# Patient Record
Sex: Male | Born: 1975 | Race: Black or African American | Hispanic: No | Marital: Single | Smoking: Current every day smoker
Health system: Southern US, Community
[De-identification: ages and names within clinical notes are randomized; demographics above are authoritative.]

## PROBLEM LIST (undated history)

## (undated) HISTORY — PX: SCAPHOID FRACTURE SURGERY: SHX769

## (undated) HISTORY — PX: ROTATOR CUFF REPAIR: SHX139

## (undated) HISTORY — PX: NO PAST SURGERIES: SHX2092

---

## 2004-11-02 ENCOUNTER — Emergency Department (HOSPITAL_COMMUNITY)
Admission: EM | Admit: 2004-11-02 | Discharge: 2004-11-02 | Payer: No Typology Code available for payment source | Admitting: Emergency Medicine

## 2005-07-31 ENCOUNTER — Emergency Department (HOSPITAL_COMMUNITY): Admission: EM | Admit: 2005-07-31 | Discharge: 2005-07-31 | Payer: Self-pay | Admitting: Emergency Medicine

## 2010-03-11 ENCOUNTER — Emergency Department (HOSPITAL_COMMUNITY)
Admission: EM | Admit: 2010-03-11 | Discharge: 2010-03-12 | Payer: No Typology Code available for payment source | Admitting: Emergency Medicine
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
## 2013-04-10 ENCOUNTER — Ambulatory Visit (INDEPENDENT_AMBULATORY_CARE_PROVIDER_SITE_OTHER): Payer: No Typology Code available for payment source | Admitting: Emergency Medicine

## 2013-04-10 VITALS — BP 139/87 | HR 98 | Temp 98.2°F | Resp 16 | Ht 72.5 in | Wt 185.6 lb

## 2013-04-10 DIAGNOSIS — S335XXA Sprain of ligaments of lumbar spine, initial encounter: Secondary | ICD-10-CM

## 2013-04-10 LAB — POCT URINALYSIS DIPSTICK
Blood, UA: NEGATIVE
Protein, UA: NEGATIVE
Spec Grav, UA: >=1.03
Urobilinogen, UA: 1
pH, UA: 6

## 2013-04-10 MED ORDER — TRAMADOL HCL 50 MG PO TABS: 50.0000 mg | ORAL_TABLET | Freq: Three times a day (TID) | ORAL | Status: DC | PRN

## 2013-04-10 MED ORDER — NAPROXEN SODIUM 550 MG PO TABS: 550.0000 mg | ORAL_TABLET | Freq: Two times a day (BID) | ORAL | Status: DC

## 2013-04-10 MED ORDER — CYCLOBENZAPRINE HCL 10 MG PO TABS: 10.0000 mg | ORAL_TABLET | Freq: Three times a day (TID) | ORAL | Status: DC | PRN

## 2013-04-10 NOTE — Progress Notes (Addendum)
[facility] [address], [place] [address] [phone]  Date:  04/10/2013   Name:  Nicholas Lawson   DOB:  1976/10/07   MRN:  [id number]  PCP:  No PCP Per Patient    Chief Complaint: Back Pain   History of Present Illness:  Nicholas Lawson is a 37 y.o. very pleasant male patient who presents with the following:  1 week history of low back pain that worsens with bending and twisting.  Non radiating.  No neuro symptoms.  No weakness or pain radiation.  No GI or GU symptoms.  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  Also has bilateral lower flank pains that come and go with no particular periodicity or provocative or ameliorating factor.  Denies prior low back pain or injury.  No improvement with over the counter medications or other home remedies. Denies other complaint or health concern today..  There are no active problems to display for this patient.   History reviewed. No pertinent past medical history.  History reviewed. No pertinent past surgical history.  History  Substance Use Topics  . Smoking status: Not on file  . Smokeless tobacco: Not on file  . Alcohol Use: Not on file    Family History  Problem Relation Age of Onset  . Hypertension Mother   . Hypertension Father     No Known Allergies  Medication list has been reviewed and updated.  No current outpatient prescriptions on file prior to visit.   No current facility-administered medications on file prior to visit.    Review of Systems:  As per HPI, otherwise negative.    Physical Examination: Filed Vitals:   04/10/13 1429  BP: 139/87  Pulse: 98  Temp: 98.2 F (36.8 C)  Resp: 16   Filed Vitals:   04/10/13 1429  Height: 6' 0.5" (1.842 m)  Weight: 185 lb 9.6 oz (84.188 kg)   Body mass index is 24.81 kg/(m^2). Ideal Body Weight: Weight in (lb) to have BMI = 25: 186.5   GEN: WDWN, NAD, Non-toxic, Alert & Oriented x 3 HEENT: Atraumatic, Normocephalic.  Ears and Nose: No  external deformity. EXTR: No clubbing/cyanosis/edema NEURO: Normal gait.  PSYCH: Normally interactive. Conversant. Not depressed or anxious appearing.  Calm demeanor.  BACK:  Tender paraspinous muscles in LSpine.  No ecchymosis or CVA tenderness.  Assessment and Plan: Lumbar strain Anaprox Flexeril Tramadol Local heat   Signed,  Phillips Odor, MD   Results for orders placed in visit on 04/10/13  POCT URINALYSIS DIPSTICK      Result Value Range   Color, UA dark yellow     Clarity, UA clear     Glucose, UA neg     Bilirubin, UA small     Ketones, UA trace     Spec Grav, UA >=1.030     Blood, UA neg     pH, UA 6.0     Protein, UA neg     Urobilinogen, UA 1.0     Nitrite, UA neg     Leukocytes, UA Negative

## 2013-04-10 NOTE — Patient Instructions (Addendum)

## 2014-03-03 ENCOUNTER — Emergency Department (HOSPITAL_COMMUNITY)
Admission: EM | Admit: 2014-03-03 | Discharge: 2014-03-03 | Disposition: A | Discharge status: Home / Self Care | Payer: No Typology Code available for payment source | Attending: Emergency Medicine | Admitting: Emergency Medicine

## 2014-03-03 ENCOUNTER — Encounter (HOSPITAL_COMMUNITY): Payer: Self-pay | Admitting: Emergency Medicine

## 2014-03-03 DIAGNOSIS — X500XXA Overexertion from strenuous movement or load, initial encounter: Secondary | ICD-10-CM | POA: Insufficient documentation

## 2014-03-03 DIAGNOSIS — N509 Disorder of male genital organs, unspecified: Secondary | ICD-10-CM | POA: Insufficient documentation

## 2014-03-03 DIAGNOSIS — Y9389 Activity, other specified: Secondary | ICD-10-CM | POA: Insufficient documentation

## 2014-03-03 DIAGNOSIS — F17200 Nicotine dependence, unspecified, uncomplicated: Secondary | ICD-10-CM | POA: Insufficient documentation

## 2014-03-03 DIAGNOSIS — M545 Low back pain, unspecified: Secondary | ICD-10-CM

## 2014-03-03 DIAGNOSIS — IMO0002 Reserved for concepts with insufficient information to code with codable children: Secondary | ICD-10-CM | POA: Insufficient documentation

## 2014-03-03 DIAGNOSIS — Y929 Unspecified place or not applicable: Secondary | ICD-10-CM | POA: Insufficient documentation

## 2014-03-03 DIAGNOSIS — R269 Unspecified abnormalities of gait and mobility: Secondary | ICD-10-CM | POA: Insufficient documentation

## 2014-03-03 MED ORDER — IBUPROFEN 800 MG PO TABS: 800.0000 mg | ORAL_TABLET | Freq: Three times a day (TID) | ORAL | Status: DC | PRN

## 2014-03-03 MED ORDER — METHOCARBAMOL 500 MG PO TABS: 500.0000 mg | ORAL_TABLET | Freq: Three times a day (TID) | ORAL | Status: DC | PRN

## 2014-03-03 NOTE — ED Provider Notes (Signed)
CSN: [id number]     Arrival date & time 03/03/14  1629 History  This chart was scribed for non-physician practitioner working with Nelia Shiobert L Beaton, MD by Ashley JacobsBrittany Andrews, ED scribe. This patient was seen in room WTR5/WTR5 and the patient's care was started at 5:21 PM.   First MD Initiated Contact with Patient 03/03/14 1719     Chief Complaint  Patient presents with  . Back Pain    lower     (Consider location/radiation/quality/duration/timing/severity/associated sxs/prior Treatment) Patient is a 38 y.o. male presenting with back pain. The history is provided by the patient and medical records. No language interpreter was used.  Back Pain Associated symptoms: no abdominal pain, no dysuria and no fever    HPI Comments: Nicholas Ginsremayne Casebier is a 38 y.o. male who presents to the Emergency Department complaining of lumbar pain for the past two weeks and is much worse today. Today pt twisted his back while working. Pt has a hx of chronic back pain from strenuous work. Does a lot of heavy lifting at work.  Denies prior injury. The pain radiates to his bilateral buttocks and groin. The pain is 7/10 in severity. He has tried [facility] for pain with no relief.  Worse with certain positions and movement.  Better with elevating his legs to take the pressure off his back. Denies constipation. Denies hx of cancer and IV drug use. 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 He does not have a PCP.  Denies fevers, chills, abdominal pain, loss of control of bowel or bladder, weakness of numbness of the extremities, saddle anesthesia, bowel, urinary, or vaginal complaints.      History reviewed. No pertinent past medical history. History reviewed. No pertinent past surgical history. Family History  Problem Relation Age of Onset  . Hypertension Mother   . Hypertension Father    History  Substance Use Topics  . Smoking status: Current Every Day Smoker  . Smokeless tobacco: Not on file  . Alcohol Use:  No    Review of Systems  Constitutional: Negative for fever, chills and diaphoresis.  Gastrointestinal: Negative for vomiting, abdominal pain, diarrhea and constipation.  Genitourinary: Positive for testicular pain. Negative for dysuria, urgency, frequency, hematuria, decreased urine volume and difficulty urinating.  Musculoskeletal: Positive for arthralgias, back pain, gait problem and myalgias.  All other systems reviewed and are negative.     Allergies  Review of patient's allergies indicates no known allergies.  Home Medications   Prior to Admission medications   Medication Sig Start Date End Date Taking? Authorizing Provider  Aspirin-Salicylamide-Caffeine (BC FAST PAIN RELIEF) [phone] MG PACK Take 1 packet by mouth as needed (pain).   Yes Historical Provider, MD   BP 150/105  Pulse 113  Temp(Src) 98 F (36.7 C) (Oral)  Resp 20  SpO2 99% Physical Exam  Nursing note and vitals reviewed. Constitutional: He appears well-developed and well-nourished. No distress.  HENT:  Head: Normocephalic and atraumatic.  Neck: Neck supple.  Pulmonary/Chest: Effort normal.  Abdominal: Soft. He exhibits no distension and no mass. There is no tenderness. There is no rebound and no guarding.  Musculoskeletal: Normal range of motion. He exhibits no edema and no tenderness.  Spine nontender, no crepitus, or stepoffs. Lower extremities:  Strength 5/5, sensation intact, distal pulses intact.   Mild muscular tenderness.    Neurological: He is alert.  Skin: He is not diaphoretic.    ED Course  Procedures (including critical care time) DIAGNOSTIC STUDIES: Oxygen Saturation  is 99% on room air, normal by my interpretation.    COORDINATION OF CARE:  5:25 PM Discussed course of care with pt . Pt understands and agrees.    Labs Review Labs Reviewed - No data to display  Imaging Review No results found.   EKG Interpretation None      Filed Vitals:   03/03/14 1743  BP: 146/93   Pulse: 92  Temp: 98.7 F (37.1 C)  Resp: 18     MDM   Final diagnoses:  Low back pain    Afebrile, nontoxic patient with mechanical low back pain. No red flags.  Discussed result, findings, treatment, and follow up  with patient.  Pt given return precautions.  Pt verbalizes understanding and agrees with plan.       I personally performed the services described in this documentation, which was scribed in my presence. The recorded information has been reviewed and is accurate.    Nicholas Dredgemily Redith Drach, PA-C 03/03/14 1815  Nicholas Dredgemily Leander Tout, PA-C 03/03/14 1825

## 2014-03-03 NOTE — ED Notes (Signed)
Pt c/o lower back pain x 2 weeks, states he twisted it at work today and now it is worse.

## 2014-03-03 NOTE — Discharge Instructions (Signed)
Read the information below.  Use the prescribed medication as directed.  Please discuss all new medications with your pharmacist.  You may return to the Emergency Department at any time for worsening condition or any new symptoms that concern you.   If you develop fevers, loss of control of bowel or bladder, weakness or numbness in your legs, or are unable to walk, return to the ER for a recheck.  ° ° °Back Pain, Adult °Low back pain is very common. About 1 in 5 people have back pain. The cause of low back pain is rarely dangerous. The pain often gets better over time. About half of people with a sudden onset of back pain feel better in just 2 weeks. About 8 in 10 people feel better by 6 weeks.  °CAUSES °Some common causes of back pain include: °· Strain of the muscles or ligaments supporting the spine. °· Wear and tear (degeneration) of the spinal discs. °· Arthritis. °· Direct injury to the back. °DIAGNOSIS °Most of the time, the direct cause of low back pain is not known. However, back pain can be treated effectively even when the exact cause of the pain is unknown. Answering your caregiver's questions about your overall health and symptoms is one of the most accurate ways to make sure the cause of your pain is not dangerous. If your caregiver needs more information, he or she may order lab work or imaging tests (X-rays or MRIs). However, even if imaging tests show changes in your back, this usually does not require surgery. °HOME CARE INSTRUCTIONS °For many people, back pain returns. Since low back pain is rarely dangerous, it is often a condition that people can learn to manage on their own.  °· Remain active. It is stressful on the back to sit or stand in one place. Do not sit, drive, or stand in one place for more than 30 minutes at a time. Take short walks on level surfaces as soon as pain allows. Try to increase the length of time you walk each day. °· Do not stay in bed. Resting more than 1 or 2 days can  delay your recovery. °· Do not avoid exercise or work. Your body is made to move. It is not dangerous to be active, even though your back may hurt. Your back will likely heal faster if you return to being active before your pain is gone. °· Pay attention to your body when you  bend and lift. Many people have less discomfort when lifting if they bend their knees, keep the load close to their bodies, and avoid twisting. Often, the most comfortable positions are those that put less stress on your recovering back. °· Find a comfortable position to sleep. Use a firm mattress and lie on your side with your knees slightly bent. If you lie on your back, put a pillow under your knees. °· Only take over-the-counter or prescription medicines as directed by your caregiver. Over-the-counter medicines to reduce pain and inflammation are often the most helpful. Your caregiver may prescribe muscle relaxant drugs. These medicines help dull your pain so you can more quickly return to your normal activities and healthy exercise. °· Put ice on the injured area. °· Put ice in a plastic bag. °· Place a towel between your skin and the bag. °· Leave the ice on for 15-20 minutes, 03-04 times a day for the first 2 to 3 days. After that, ice and heat may be alternated to reduce pain and spasms. °· Ask your caregiver about trying back exercises and gentle massage. This may be of some   benefit. °· Avoid feeling anxious or stressed. Stress increases muscle tension and can worsen back pain. It is important to recognize when you are anxious or stressed and learn ways to manage it. Exercise is a great option. °SEEK MEDICAL CARE IF: °· You have pain that is not relieved with rest or medicine. °· You have pain that does not improve in 1 week. °· You have new symptoms. °· You are generally not feeling well. °SEEK IMMEDIATE MEDICAL CARE IF:  °· You have pain that radiates from your back into your legs. °· You develop new bowel or bladder control  problems. °· You have unusual weakness or numbness in your arms or legs. °· You develop nausea or vomiting. °· You develop abdominal pain. °· You feel faint. °Document Released: 11/04/2005 Document Revised: 05/05/2012 Document Reviewed: 03/25/2011 °ExitCare® Patient Information ©2014 [employer]. ° ° ° °Emergency Department Resource Guide °1) Find a Doctor and Pay Out of Pocket °Although you won't have to find out who is covered by your insurance plan, it is a good idea to ask around and get recommendations. You will then need to call the office and see if the doctor you have chosen will accept you as a new patient and what types of options they offer for patients who are self-pay. Some doctors offer discounts or will set up payment plans for their patients who do not have insurance, but you will need to ask so you aren't surprised when you get to your appointment. ° °2) Contact Your Local Health Department °Not all health departments have doctors that can see patients for sick visits, but many do, so it is worth a call to see if yours does. If you don't know where your local health department is, you can check in your phone book. The CDC also has a tool to help you locate your state's health department, and many state websites also have listings of all of their local health departments. ° °3) Find a Walk-in Clinic °If your illness is not likely to be very severe or complicated, you may want to try a walk in clinic. These are popping up all over the country in pharmacies, drugstores, and shopping centers. They're usually staffed by nurse practitioners or physician assistants that have been trained to treat common illnesses and complaints. They're usually fairly quick and inexpensive. However, if you have serious medical issues or chronic medical problems, these are probably not your best option. ° °No Primary Care Doctor: °- Call [facility] at  832-8000 - they can help you locate a primary care doctor that   accepts your insurance, provides certain services, etc. °- Physician Referral Service- 1-800-533-3463 ° °Chronic Pain Problems: °Organization         Address  Phone   Notes  °[facility]  (336) 297-2271 Patients need to be referred by their primary care doctor.  ° °Medication Assistance: °Organization         Address  Phone   Notes  °[place] [facility] [address] °[place], [place] [address] (336) 641-8030 --Must be a resident of [place] °-- Must have NO insurance coverage whatsoever (no Medicaid/ Medicare, etc.) °-- The pt. MUST have a primary care doctor that directs their care regularly and follows them in the community °  °[facility]  (866) 331-1348   °[employer]  (888) 892-1162   ° °Agencies that provide inexpensive medical care: °Organization         Address  Phone     Notes  °[facility]  (336) 832-8035   °[facility]    (336) 832-7272   °[facility] [address] °[place], [place] [address] (336) 832-4777   °[facility] [address], °[place] (336) 271-4999   °[facility]    (336) 373-0678   °[facility]    (336) 272-1050   °[facility] ° [address], [place] Phone:  (336) 832-4444, Fax:  (336) 832-4440 Hours of Operation:  9 am - 6 pm, M-F.  Also accepts Medicaid/Medicare and self-pay.  °[facility] ° [address], [place] Phone: (336) 832-3150, Fax: (336) 832-3151. Hours of Operation:  8:30 am - 5:30 pm, M-F.  Also accepts Medicaid and self-pay.  °[facility] [place] [address], [place] Phone: (336) 878-6027   °[facility] [address], [place], [place] (336)723-1848, Ext. 123 Mondays & Thursdays: 7-9 AM.  First 15 patients are seen on a first come, first serve basis. °  ° °Medicaid-accepting [place] Providers: ° °Organization          Address  Phone   Notes  °[facility] [address], [place] (336) 641-2100 Also accepts self-pay patients.  °[facility] [address], [place] ° (336) 856-9996   °[facility] [address], [place] (336) 288-8857   °[facility] [address] [place] [address], [place] (336) 299-7000   °Veita Bland [address], [place]  ° (336) 373-1557 Only accepts [employer] Access Medicaid patients after they have their name applied to their card.  ° °Self-Pay (no insurance) in [place]: ° °Organization         Address  Phone   Notes  °Sickle Cell Patients, [facility] [address], [place] (336) 832-1970   °[facility] [address], [place] (336) 832-4400   ° [facility] ° [address], [place] (336) 992-4800   °[facility]/Dr. Osei-Bonsu ° [address] [place] [address], [place] or [address], [place] (336) 841-8500 Phone number for both [place] and [place] locations is the same.  °[facility] [address], [place] (336) 299-0000   °[facility] [address] [place] [address], [place] or [address] (336) 852-7530 °(336) 878-2260   °[facility] [address], [place] (336) 350-1642, phone; (336) 294-5005, fax Sees patients 1st and 3rd Saturday of every month.  Must not qualify for public or private insurance (i.e. Medicaid, Medicare, [place] Health Choice, Veterans' Benefits) • Household income should be no more than 200% of the poverty level •The clinic cannot treat you if you are pregnant or think you are pregnant • Sexually transmitted diseases are not treated at the clinic.  ° ° °Dental Care: °Organization         Address  Phone  Notes  °[place] [facility] [address],  [place] (336) 641-6152 Accepts children up to age 21 who are enrolled in Medicaid or [place] Health Choice; pregnant women with a Medicaid card; and children who have applied for Medicaid or [place] Health Choice, but were declined, whose parents can pay a reduced fee at time of service.  °[place]   [facility] [place]  [address], [place] (336) 641-7733 Accepts children up to age 21 who are enrolled in Medicaid or [place] Health Choice; pregnant women with a Medicaid card; and children who have applied for Medicaid or [place] Health Choice, but were declined, whose parents can pay a reduced fee at time of service.  °[facility] ° [address], [place] (336) 641-4533 Patients are seen by appointment only. Walk-ins are not accepted. [facility] will see patients 18 years of age and older. °Monday - Tuesday (8am-5pm) °Most Wednesdays (8:30-5pm) °$30 per visit, cash only  °[facility] ° [address], [place] (336) 641-4533 Patients are seen by appointment only. Walk-ins are not accepted. [facility] will see patients 18 years of age and older. °One Wednesday Evening (Monthly: Volunteer Based).  $30 per visit, cash only  °[facility]  (919) 537-3737 for adults; Children under age 4, call Graduate Pediatric Dentistry at (919) [phone]. Children aged 4-14, please call (919) [phone] to request a pediatric application. ° Dental services are provided in all areas of dental care including fillings, crowns and bridges, complete and partial dentures, implants, gum treatment, root canals, and extractions. Preventive care is also provided. Treatment is provided to both adults and children. °Patients are selected via a lottery and there is often a waiting list. °  °[facility] [address], °[place] ° (336) 763-8833 [email or web address] °  °[facility] [address], [place], [place]  (336)723-1848, Ext. 123 Second and Fourth Thursday of each month, opens at 6:30 AM; Clinic ends at 9 AM.  Patients are seen on a first-come first-served basis, and a limited number are seen during each clinic.  ° °[facility] ° [address], [place], [place] (336) 723-7904   Eligibility Requirements °You must have lived in [place], [place], or [place] counties for at least the last three months. °  You cannot be eligible for state or federal sponsored healthcare insurance, including Veterans Administration, Medicaid, or Medicare. °  You generally cannot be eligible for healthcare insurance through your employer.  °  How to apply: °Eligibility screenings are held every Tuesday and Wednesday afternoon from 1:00 pm until 4:00 pm. You do not need an appointment for the interview!  °[facility] [address], [place], [place] 336-631-2330   °[facility]  336-342-8273   °[facility]  336-703-3100   °[facility]  336-570-6415   ° °Behavioral Health Resources in the Community: °Intensive Outpatient Programs °Organization         Address  Phone  Notes  °[facility] [address], [place], [place] 336-878-6098   °[facility] [address], [place], [place] 336-832-9800   °[facility] [address], [place],  ° 336-882-2125   °[facility] [address],  °[place],  1-800-853-5163 or 336-641-4981   °Substance Abuse Resources °Organization         Address  Phone  Notes  °[facility]  336-882-2125   °[facility]  336-784-9470   °[facility]  336-285-9073   °[facility]  336-845-3988   °Residential & Outpatient Substance Abuse Program  1-800-659-3381   °Psychological Services °Organization         Address  Phone  Notes  °[facility]  336- 832-9600   °[facility]  336- 378-7881    °[facility] [address], [place] 1-800-853-5163 or 336-641-4981   ° °Mobile Crisis Teams °Organization         Address  Phone  Notes  °[facility], Mobile Crisis Care Unit  1-877-626-1772   °Assertive °[facility] ° [address] [place], [place] 336-834-9664   °Sharon DeEsch [address] °[place] 336-554-5454   ° °Self-Help/Support Groups °Organization         Address  Phone             Notes  °[facility] - variety of support groups  336- 373-1402 Call for more information  °Narcotics Anonymous (NA), [facility] [address], °[place]  2 meetings at this location  ° °Residential Treatment Programs °Organization         Address  Phone  Notes  °[facility] [address],    °[place]  1-866-801-8205   °[facility] ° [address], [place], [place] 704-293-8524   °[facility] [address], [place] 336-845-3988 Admissions: 8am-3pm M-F  °[facility] [address]    °[place], [place] 336-841-1104   °[facility] [address], [place], [place] 336-379-7146   °[facility] [address]  °[place], [place] 336-285-9073   °[facility] - Intensive Outpatient [address], [place], [place] 336-852-3033   °[facility] [address]  °[place], [place] 1-877-615-2722 or 336-784-9470   °[facility] ([facility] [address] [place], [place] 336-227-7417 Accepts Medicaid  °[facility] [address]  °[place] 1-800-659-3381 Substance Abuse/Addiction Treatment  ° °[place] Behavioral Health Resources °Organization         Address  Phone  Notes  °[facility]  (888) 581-9988   °Julie Brannon, PhD [address] [place], [place]   (336) 349-5553 or (336) 951-0000   °[facility]   [address] °[place], [place] (336) 349-4454   °[facility] [address], [place], [place] (336) 342-8316 Insurance/Medicaid/sponsorship through [facility]  °[facility] [address]                                    [place], [place] (336) 342-8316 Therapy/tele-psych/case  °[facility] [address]  ° [place], [place] (336) 349-2233    °Dr. Arfeen  (336) 349-4544   °[facility] [place]  [employer] [place] [facility] 1) [address], [place] °2) [address], [place] °3)  [address], [place] (336) 349-3220 °(336) 342-7768 ° °(336) 342-8140   °[place] [facility] (336) 342-1394 or (336) 342-3537 (After Hours)    ° ° ° °

## 2014-03-10 NOTE — ED Provider Notes (Signed)
Medical screening examination/treatment/procedure(s) were performed by non-physician practitioner and as supervising physician I was immediately available for consultation/collaboration.    Nelia Shiobert L Alrick Cubbage, MD 03/10/14 [phone]

## 2014-03-12 ENCOUNTER — Ambulatory Visit: Payer: No Typology Code available for payment source

## 2014-03-12 ENCOUNTER — Ambulatory Visit (INDEPENDENT_AMBULATORY_CARE_PROVIDER_SITE_OTHER): Payer: No Typology Code available for payment source | Admitting: Family Medicine

## 2014-03-12 VITALS — BP 120/76 | HR 89 | Temp 98.0°F | Resp 18 | Ht 71.0 in | Wt 180.0 lb

## 2014-03-12 DIAGNOSIS — M545 Low back pain, unspecified: Secondary | ICD-10-CM

## 2014-03-12 DIAGNOSIS — S335XXA Sprain of ligaments of lumbar spine, initial encounter: Secondary | ICD-10-CM

## 2014-03-12 IMAGING — CR DG LUMBAR SPINE COMPLETE 4+V
5 series · 5 of 5 positions shown · non-contrast
Comparison: Plain films lumbar spine [DATE].

CLINICAL DATA: Low back pain for 2 weeks.

EXAM:
LUMBAR SPINE - COMPLETE 4+ VIEW

[AP]
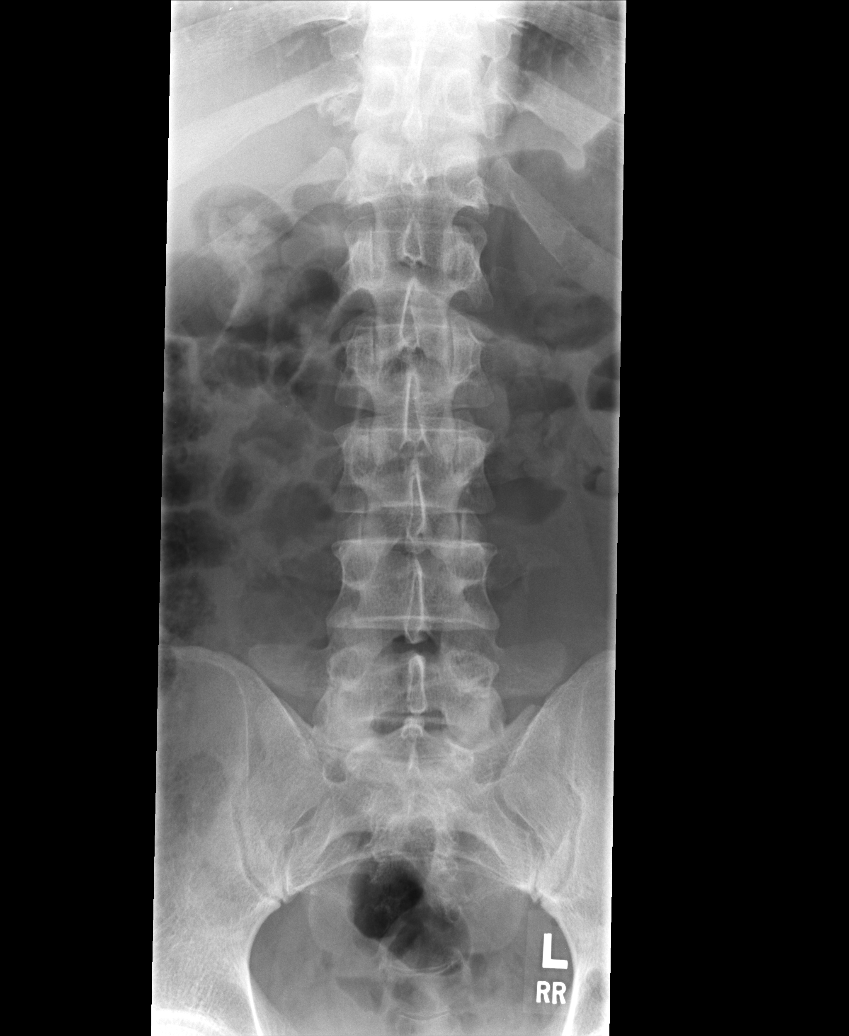

[rpo]
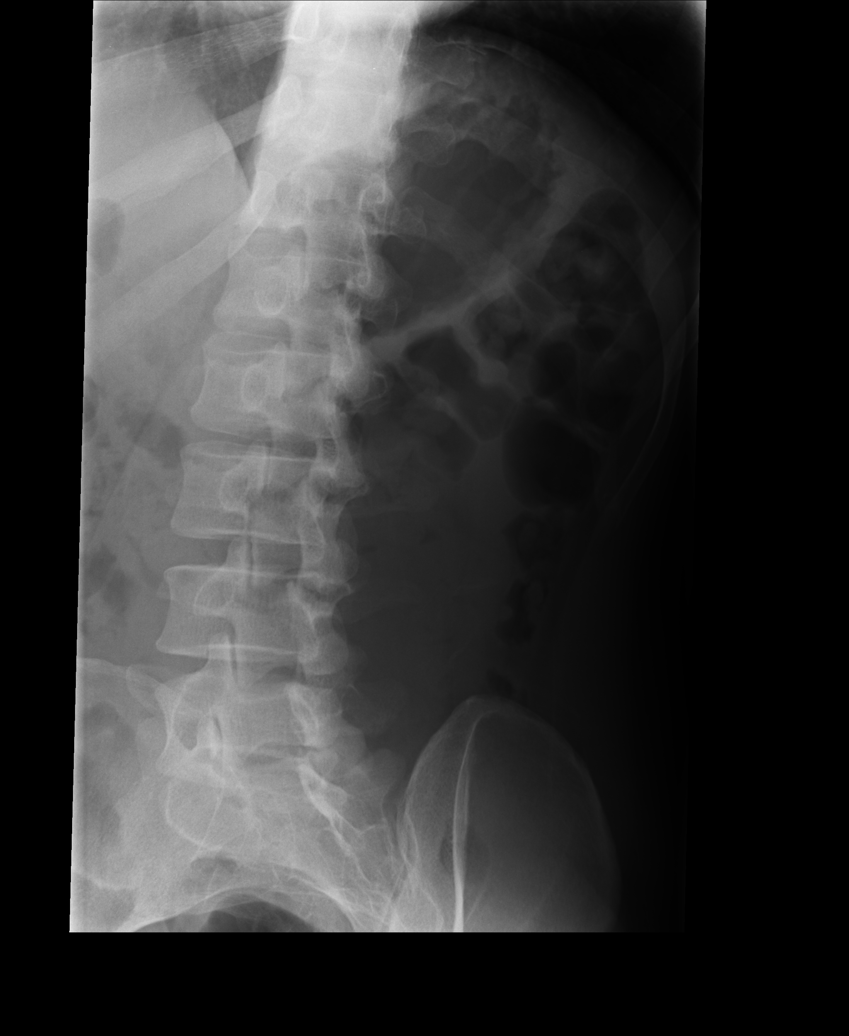

[lpo]
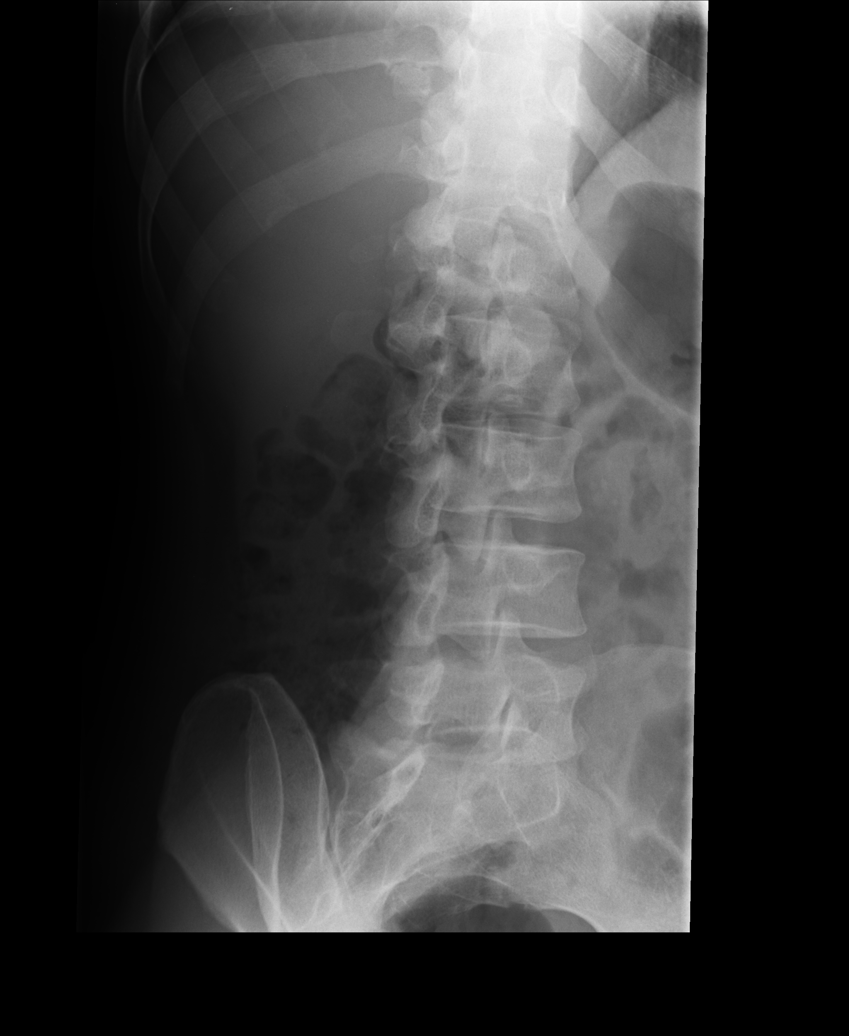

[lateral]
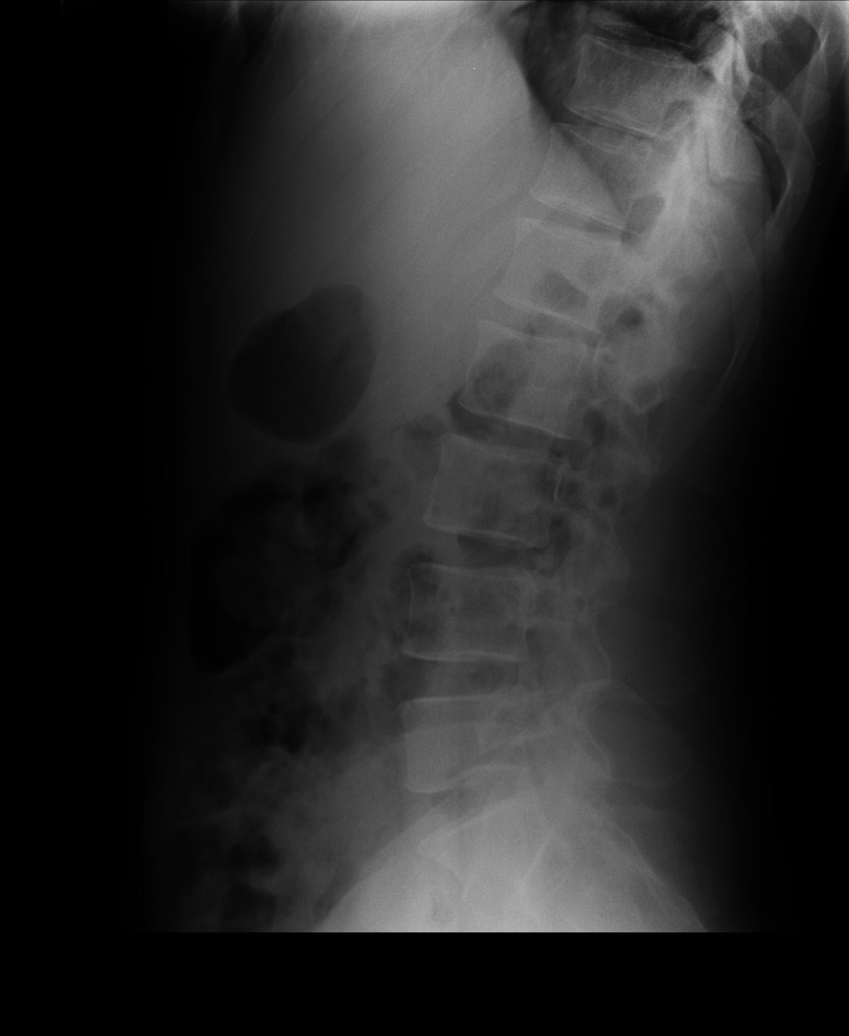

[l5 s1]
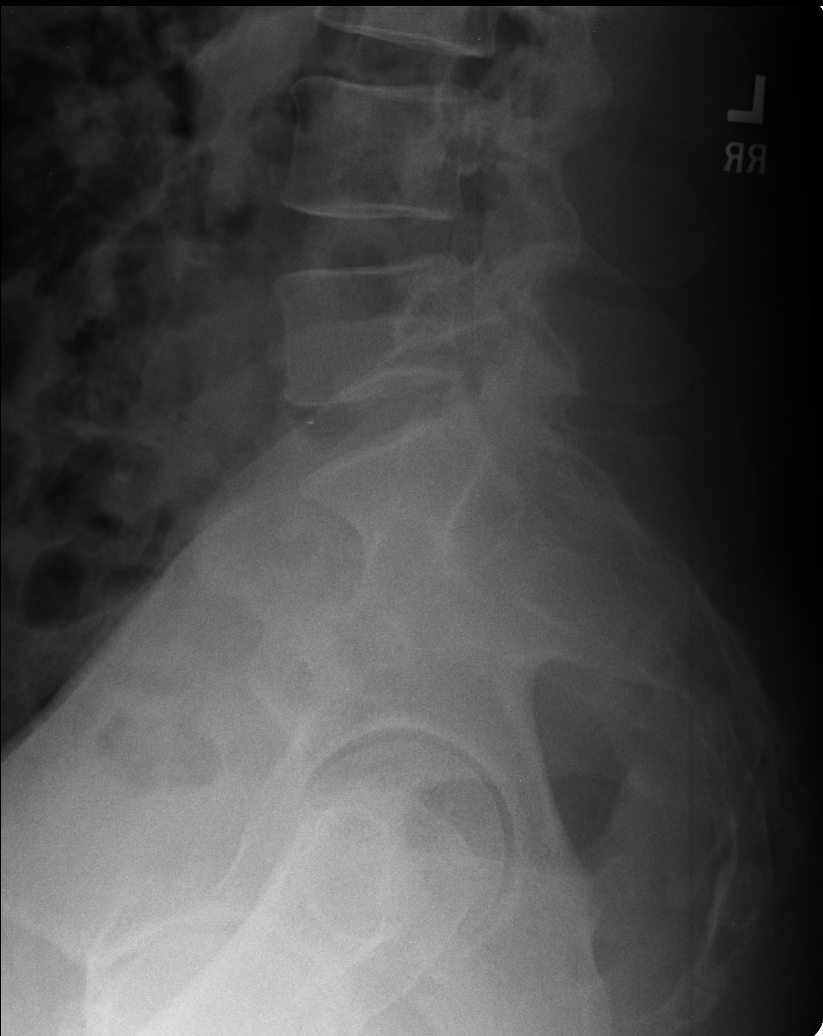

[5 of 5 positions shown; findings below may reference images not displayed]

FINDINGS: Vertebral body height and alignment are maintained. No pars
interarticularis defect is identified. Intervertebral disc space
height is normal. Paraspinous structures are unremarkable.
IMPRESSION: Negative exam.

## 2014-03-12 MED ORDER — IBUPROFEN 800 MG PO TABS: 800.0000 mg | ORAL_TABLET | Freq: Three times a day (TID) | ORAL | Status: DC | PRN

## 2014-03-12 MED ORDER — METHOCARBAMOL 500 MG PO TABS: 500.0000 mg | ORAL_TABLET | Freq: Three times a day (TID) | ORAL | Status: DC | PRN

## 2014-03-12 MED ORDER — TRAMADOL HCL 50 MG PO TABS: 50.0000 mg | ORAL_TABLET | Freq: Three times a day (TID) | ORAL | Status: DC | PRN

## 2014-03-12 NOTE — Patient Instructions (Signed)

## 2014-03-12 NOTE — Progress Notes (Addendum)
This chart was scribed for Ethelda ChickKristi M Rella Egelston, MD by Luisa DagoPriscilla Tutu, ED Scribe. This patient was seen in room 9 and the patient's care was started at 6:27 PM. Subjective:    Patient ID: Nicholas Lawson, male    DOB: 09-Mar-1976, 38 y.o.   MRN: [id number]  03/12/2014  Back Pain   HPI HPI Comments: Nicholas Creekremayne T Granja is a 38 y.o. male who presents to [facility] complaining of worsening lower back pain that started 2 weeks ago. Pt was seen in the ED 9 days ago on 03/03/14. He was prescribed Robaxin and Advil 800 MG. No X-Rays were obtained during that visit. Pt states that the pain radiates down to his legs. Pt states that his job requires him to do lifting every day, but the weight doesn't exceed 5 lbs. Pt is experiencing associated pressure in his testicles. He denies taking any OTC medication or any of the prescribed medication. Pt states that the pain is worsened by sitting down for long periods of time. He denies any saddle paraesthesia, abdominal pain, nausea, emesis, congestion, fever, chills, diaphoresis, headaches, bowel or bladder incontinence.     Review of Systems  Constitutional: Negative for chills, diaphoresis, appetite change and fatigue.  HENT: Negative for mouth sores, sore throat and trouble swallowing.   Eyes: Negative for visual disturbance.  Respiratory: Negative for cough, chest tightness, shortness of breath and wheezing.   Gastrointestinal: Negative for nausea, vomiting, diarrhea and abdominal distention.  Endocrine: Negative for polydipsia, polyphagia and polyuria.  Genitourinary: Positive for testicular pain (testicular pressure). Negative for frequency and hematuria.  Musculoskeletal: Positive for back pain. Negative for gait problem.  Skin: Negative for color change, pallor and rash.  Neurological: Negative for dizziness, syncope and light-headedness.  Hematological: Does not bruise/bleed easily.  Psychiatric/Behavioral: Negative for behavioral  problems and confusion.    History reviewed. No pertinent past medical history. No Known Allergies Current Outpatient Prescriptions  Medication Sig Dispense Refill  . Aspirin-Salicylamide-Caffeine (BC FAST PAIN RELIEF) 650-195-33.3 MG PACK Take 1 packet by mouth as needed (pain).      Marland Kitchen. ibuprofen (ADVIL,MOTRIN) 800 MG tablet Take 1 tablet (800 mg total) by mouth every 8 (eight) hours as needed for mild pain or moderate pain.  30 tablet  0  . methocarbamol (ROBAXIN) 500 MG tablet Take 1 tablet (500 mg total) by mouth every 8 (eight) hours as needed for muscle spasms (or pain).  30 tablet  0  . traMADol (ULTRAM) 50 MG tablet Take 1 tablet (50 mg total) by mouth every 8 (eight) hours as needed.  30 tablet  0   No current facility-administered medications for this visit.   History   Social History  . Marital Status: Single    Spouse Name: N/A    Number of Children: N/A  . Years of Education: N/A   Occupational History  . Not on file.   Social History Main Topics  . Smoking status: Current Every Day Smoker  . Smokeless tobacco: Not on file  . Alcohol Use: No  . Drug Use: Not on file  . Sexual Activity: Not on file   Other Topics Concern  . Not on file   Social History Narrative  . No narrative on file       Objective:    BP 120/76  Pulse 89  Temp(Src) 98 F (36.7 C)  Resp 18  Ht 5\' 11"  (1.803 m)  Wt 180 lb (81.647 kg)  BMI 25.12 kg/m2  SpO2 98%  Physical Exam  Nursing note and vitals reviewed. Constitutional: He appears well-developed and well-nourished. No distress.  HENT:  Head: Normocephalic and atraumatic.  Eyes: Conjunctivae are normal. Right eye exhibits no discharge. Left eye exhibits no discharge.  Neck: Neck supple.  Cardiovascular: Normal rate, regular rhythm and normal heart sounds.  Exam reveals no gallop and no friction rub.   No murmur heard. Pulmonary/Chest: Effort normal and breath sounds normal. No respiratory distress.  Abdominal: Soft. He  exhibits no distension. There is no tenderness.  Musculoskeletal: Normal range of motion. He exhibits no edema and no tenderness.       Lumbar back: He exhibits pain. He exhibits normal range of motion, no tenderness, no bony tenderness and no spasm.  Nontender midline lumbar and paraspinal area. Positive straight leg raise on L.  Toe and heel walking intact; marching intact. Motor 5/5 BLE.  Neurological: He is alert. No cranial nerve deficit or sensory deficit. He exhibits normal muscle tone. Coordination normal.  Reflex Scores:      Patellar reflexes are 2+ on the right side and 2+ on the left side.      Achilles reflexes are 2+ on the right side and 2+ on the left side. Skin: Skin is warm and dry.  Psychiatric: He has a normal mood and affect. His behavior is normal. Thought content normal.   [facility] reading (PRIMARY) by  Dr. Katrinka BlazingSmith.  LUMBAR SPINE:  NAD      Assessment & Plan:   1. Low back pain   2. Lumbar sprain     1. Low back pain/lumbar sprain:  New.  Rx for Ibuprofen 800mg  tid, Robaxin 500mg  tid, Tramadol 50mg  tid.  Recommend heat to area bid to tid for 15-20 minutes each session.  Home exercise program provided to perform daily; if no improvement in two weeks, call for ortho referral.   Meds ordered this encounter  Medications  . ibuprofen (ADVIL,MOTRIN) 800 MG tablet    Sig: Take 1 tablet (800 mg total) by mouth every 8 (eight) hours as needed for mild pain or moderate pain.    Dispense:  30 tablet    Refill:  0    Order Specific Question:  Supervising Provider    Answer:  Eber HongMILLER, BRIAN D [[id number]]  . methocarbamol (ROBAXIN) 500 MG tablet    Sig: Take 1 tablet (500 mg total) by mouth every 8 (eight) hours as needed for muscle spasms (or pain).    Dispense:  30 tablet    Refill:  0    Order Specific Question:  Supervising Provider    Answer:  Eber HongMILLER, BRIAN D [[id number]]  . traMADol (ULTRAM) 50 MG tablet    Sig: Take 1 tablet (50 mg total) by mouth every 8 (eight) hours as needed.      Dispense:  30 tablet    Refill:  0    No Follow-up on file.  I personally performed the services described in this documentation, which was scribed in my presence. The recorded information has been reviewed and is accurate.   Nilda SimmerKristi Anslie Spadafora, M.D.  [facility]  [place] [address] [place]  [id number] [phone] phone [phone] fax

## 2014-06-03 ENCOUNTER — Ambulatory Visit: Payer: No Typology Code available for payment source | Admitting: Internal Medicine

## 2014-07-01 ENCOUNTER — Telehealth: Payer: Self-pay

## 2014-07-01 NOTE — Telephone Encounter (Signed)
[facility]riage nurse Amy at [facility] for [facility] on patient.  I returned the call, no one was at the desk, "no messages may be left on this machine" recording was heard.  If they should call back we need a medical release signed by the patient, to release this information, as we did NOT refer the patient.   [phone]  Ext [phone]

## 2014-07-05 NOTE — Telephone Encounter (Signed)
Left message for patient letting him know to sign a release form at our office or [facility] and have them fax it to us so we can send them records.

## 2014-09-07 ENCOUNTER — Ambulatory Visit (INDEPENDENT_AMBULATORY_CARE_PROVIDER_SITE_OTHER): Payer: No Typology Code available for payment source | Admitting: Internal Medicine

## 2014-09-07 ENCOUNTER — Encounter: Payer: Self-pay | Admitting: Internal Medicine

## 2014-09-07 VITALS — BP 134/82 | HR 78 | Temp 98.4°F | Ht 72.0 in | Wt 184.0 lb

## 2014-09-07 DIAGNOSIS — M5441 Lumbago with sciatica, right side: Secondary | ICD-10-CM

## 2014-09-07 DIAGNOSIS — M5442 Lumbago with sciatica, left side: Secondary | ICD-10-CM

## 2014-09-07 DIAGNOSIS — M549 Dorsalgia, unspecified: Secondary | ICD-10-CM | POA: Insufficient documentation

## 2014-09-07 MED ORDER — PREDNISONE 10 MG PO TABS: ORAL_TABLET | ORAL | Status: DC

## 2014-09-07 NOTE — Assessment & Plan Note (Addendum)
Several months history of back pain, initially steadynow on and off associated with lower extremity cramps. Description of the symptoms sound like neurogenic claudication. Vascular exam is normal. Plan:  Refer to ortho for further eval, likely will need a MRI. Prednisone for a few days to see if that helps the pain. (h/o failure to improve w/ Robaxin and Ultram)

## 2014-09-07 NOTE — Patient Instructions (Signed)
Take prednisone as prescribed We are referring you to a back specialist If you don't  hear from us within a week, please call the office

## 2014-09-07 NOTE — Progress Notes (Signed)
Pre visit review using our clinic review tool, if applicable. No additional management support is needed unless otherwise documented below in the visit note. 

## 2014-09-07 NOTE — Progress Notes (Signed)
   Subjective:    Patient ID: Nicholas Lawson, male    DOB: [birth year], 38 y.o.   MRN: [id number]  DOS:  09/07/2014 Type of visit - description : new, acute Interval history: Has a long history of on-off back pain but developed a severe episode April 2015. Pain was severe, sharp, lasted several weeks; was seen at the ER  and the urgent care, Robaxin and Ultram did not help much. X-ray was negative. Several weeks later, the pain become on and off, only when he is his torso or bends forward but at the same time has developed bilateral lower extremity cramps, triggered by walking and sometimes are spontaneous. Reports  Had a physical exam 07-2014 at [facility] , all labs normal  ROS Denies fever or chills No lower extremity rash No bladder or bowel incontinence Occasional tingling in his toes mostly on the right. Again walking cause cramps in the legs  History reviewed. No pertinent past medical history.  Past Surgical History  Procedure Laterality Date  . No past surgeries      History   Social History  . Marital Status: Single    Spouse Name: N/A    Number of Children: 2  . Years of Education: N/A   Occupational History  . self employed    Social History Main Topics  . Smoking status: Current Every Day Smoker  . Smokeless tobacco: Never Used     Comment: 1/2 ppd  . Alcohol Use: Yes  . Drug Use: Not on file  . Sexual Activity: Not on file   Other Topics Concern  . Not on file   Social History Narrative   Single, lives w/ his children     Family History  Problem Relation Age of Onset  . Hypertension Mother   . Hypertension Father   . Colon cancer Neg Hx   . Prostate cancer Neg Hx   . CAD        Medication List       This list is accurate as of: 09/07/14 11:59 PM.  Always use your most recent med list.               predniSONE 10 MG tablet  Commonly known as:  DELTASONE  4 tablets x 2 days, 3 tabs x 2 days, 2 tabs x 2 days, 1 tab x 2 days             Objective:   Physical Exam BP 134/82  Pulse 78  Temp(Src) 98.4 F (36.9 C) (Oral)  Ht 6' (1.829 m)  Wt 184 lb (83.462 kg)  BMI 24.95 kg/m2  SpO2 96% General -- alert, well-developed, NAD.   HEENT-- Not pale.   Lungs -- normal respiratory effort, no intercostal retractions, no accessory muscle use, and normal breath sounds.  Heart-- normal rate, regular rhythm, no murmur.  Abdomen-- Not distended, good bowel sounds,soft, non-tender.  Extremities-- no pretibial edema bilaterally ; Normal femoral and pedal pulses Neurologic--  alert & oriented X3. Speech normal, gait appropriate for age, strength symmetric and appropriate for age.  DTRs symmetric. Gait normal, straight leg test : c/o back pain with leg elevation 45 bilaterally Psych-- Cognition and judgment appear intact. Cooperative with normal attention span and concentration. No anxious or depressed appearing.        Assessment & Plan:

## 2014-09-08 ENCOUNTER — Telehealth: Payer: Self-pay | Admitting: Internal Medicine

## 2014-09-08 NOTE — Telephone Encounter (Signed)
emmi emailed °

## 2014-10-29 ENCOUNTER — Emergency Department (HOSPITAL_COMMUNITY)
Admission: EM | Admit: 2014-10-29 | Discharge: 2014-10-29 | Disposition: A | Discharge status: Home / Self Care | Payer: No Typology Code available for payment source | Attending: Emergency Medicine | Admitting: Emergency Medicine

## 2014-10-29 ENCOUNTER — Encounter (HOSPITAL_COMMUNITY): Payer: Self-pay | Admitting: Emergency Medicine

## 2014-10-29 ENCOUNTER — Emergency Department (HOSPITAL_COMMUNITY): Payer: No Typology Code available for payment source

## 2014-10-29 DIAGNOSIS — S4992XA Unspecified injury of left shoulder and upper arm, initial encounter: Secondary | ICD-10-CM | POA: Insufficient documentation

## 2014-10-29 DIAGNOSIS — Y92410 Unspecified street and highway as the place of occurrence of the external cause: Secondary | ICD-10-CM | POA: Diagnosis not present

## 2014-10-29 DIAGNOSIS — S0990XA Unspecified injury of head, initial encounter: Secondary | ICD-10-CM | POA: Insufficient documentation

## 2014-10-29 DIAGNOSIS — Y998 Other external cause status: Secondary | ICD-10-CM | POA: Diagnosis not present

## 2014-10-29 DIAGNOSIS — Y9389 Activity, other specified: Secondary | ICD-10-CM | POA: Insufficient documentation

## 2014-10-29 DIAGNOSIS — Z720 Tobacco use: Secondary | ICD-10-CM | POA: Diagnosis not present

## 2014-10-29 DIAGNOSIS — S39012A Strain of muscle, fascia and tendon of lower back, initial encounter: Secondary | ICD-10-CM | POA: Insufficient documentation

## 2014-10-29 DIAGNOSIS — S4991XA Unspecified injury of right shoulder and upper arm, initial encounter: Secondary | ICD-10-CM | POA: Diagnosis not present

## 2014-10-29 DIAGNOSIS — S161XXA Strain of muscle, fascia and tendon at neck level, initial encounter: Secondary | ICD-10-CM | POA: Insufficient documentation

## 2014-10-29 DIAGNOSIS — S199XXA Unspecified injury of neck, initial encounter: Secondary | ICD-10-CM | POA: Diagnosis present
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
## 2014-10-29 MED ORDER — CYCLOBENZAPRINE HCL 10 MG PO TABS: 10.0000 mg | ORAL_TABLET | Freq: Two times a day (BID) | ORAL | Status: DC | PRN

## 2014-10-29 MED ORDER — HYDROCODONE-ACETAMINOPHEN 5-325 MG PO TABS: 2.0000 | ORAL_TABLET | ORAL | Status: DC | PRN
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
## 2017-03-31 ENCOUNTER — Emergency Department (HOSPITAL_COMMUNITY): Payer: Worker's Compensation

## 2017-03-31 ENCOUNTER — Encounter (HOSPITAL_COMMUNITY): Payer: Self-pay | Admitting: Emergency Medicine

## 2017-03-31 ENCOUNTER — Emergency Department (HOSPITAL_COMMUNITY)
Admission: EM | Admit: 2017-03-31 | Discharge: 2017-03-31 | Disposition: A | Discharge status: Home / Self Care | Payer: Worker's Compensation | Attending: Emergency Medicine | Admitting: Emergency Medicine

## 2017-03-31 DIAGNOSIS — S80212A Abrasion, left knee, initial encounter: Secondary | ICD-10-CM | POA: Diagnosis not present

## 2017-03-31 DIAGNOSIS — F17200 Nicotine dependence, unspecified, uncomplicated: Secondary | ICD-10-CM | POA: Diagnosis not present

## 2017-03-31 DIAGNOSIS — Z79899 Other long term (current) drug therapy: Secondary | ICD-10-CM | POA: Diagnosis not present

## 2017-03-31 DIAGNOSIS — X58XXXA Exposure to other specified factors, initial encounter: Secondary | ICD-10-CM | POA: Diagnosis not present

## 2017-03-31 DIAGNOSIS — Y939 Activity, unspecified: Secondary | ICD-10-CM | POA: Insufficient documentation

## 2017-03-31 DIAGNOSIS — Y990 Civilian activity done for income or pay: Secondary | ICD-10-CM | POA: Insufficient documentation

## 2017-03-31 DIAGNOSIS — S6992XA Unspecified injury of left wrist, hand and finger(s), initial encounter: Secondary | ICD-10-CM | POA: Diagnosis present

## 2017-03-31 DIAGNOSIS — S50312A Abrasion of left elbow, initial encounter: Secondary | ICD-10-CM | POA: Insufficient documentation

## 2017-03-31 DIAGNOSIS — M25532 Pain in left wrist: Secondary | ICD-10-CM | POA: Diagnosis not present

## 2017-03-31 DIAGNOSIS — Y929 Unspecified place or not applicable: Secondary | ICD-10-CM | POA: Insufficient documentation
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
## 2017-03-31 NOTE — Discharge Instructions (Signed)
Wear splint as directed for support and immobilization. Continue Tylenol as needed for pain control. Follow-up with PCP for further evaluation if needed. Return to ED for worsening pain, numbness, trouble walking, additional injury, trouble breathing, vision changes.

## 2017-03-31 NOTE — ED Notes (Signed)
Notified ortho tech of order 
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
## 2018-05-01 ENCOUNTER — Emergency Department (HOSPITAL_COMMUNITY)
Admission: EM | Admit: 2018-05-01 | Discharge: 2018-05-01 | Disposition: A | Discharge status: Home / Self Care | Payer: No Typology Code available for payment source | Attending: Emergency Medicine | Admitting: Emergency Medicine

## 2018-05-01 ENCOUNTER — Encounter (HOSPITAL_COMMUNITY): Payer: Self-pay

## 2018-05-01 DIAGNOSIS — T63441A Toxic effect of venom of bees, accidental (unintentional), initial encounter: Secondary | ICD-10-CM | POA: Insufficient documentation

## 2018-05-01 DIAGNOSIS — Z79899 Other long term (current) drug therapy: Secondary | ICD-10-CM | POA: Insufficient documentation

## 2018-05-01 DIAGNOSIS — R000 Tachycardia, unspecified: Secondary | ICD-10-CM | POA: Insufficient documentation

## 2018-05-01 DIAGNOSIS — F17200 Nicotine dependence, unspecified, uncomplicated: Secondary | ICD-10-CM | POA: Insufficient documentation

## 2018-05-01 MED ORDER — EPINEPHRINE 0.3 MG/0.3ML IJ SOAJ
0.3000 mg | Freq: Once | INTRAMUSCULAR | Status: AC
  Administered 2018-05-01: 0.3 mg via INTRAMUSCULAR
  Filled 2018-05-01: qty 0.3

## 2018-05-01 MED ORDER — EPINEPHRINE 0.3 MG/0.3ML IJ SOAJ: 0.3000 mg | Freq: Once | INTRAMUSCULAR | 1 refills | Status: AC

## 2018-05-01 MED ORDER — METHYLPREDNISOLONE SODIUM SUCC 125 MG IJ SOLR
125.0000 mg | Freq: Once | INTRAMUSCULAR | Status: AC
  Administered 2018-05-01: 125 mg via INTRAVENOUS
  Filled 2018-05-01: qty 2

## 2018-05-01 MED ORDER — PREDNISONE 20 MG PO TABS: 60.0000 mg | ORAL_TABLET | Freq: Every day | ORAL | 0 refills | Status: DC

## 2018-05-01 MED ORDER — DIPHENHYDRAMINE HCL 50 MG/ML IJ SOLN
25.0000 mg | Freq: Once | INTRAMUSCULAR | Status: AC
  Administered 2018-05-01: 25 mg via INTRAVENOUS
  Filled 2018-05-01: qty 1

## 2018-05-01 NOTE — Discharge Instructions (Addendum)
Your evaluated in the emergency department for severe allergic reaction to bee sting.  You were observed for 4 hours with no progression of your symptoms.  We are prescribing a prednisone to take for 4 more days.  You should continue to also use Benadryl as needed for itching.  We are also prescribing you a epinephrine autoinjector if you have a similar episode again.  He will be important that you come to the hospital for evaluation even if you feel better after using the injector.  Please follow-up with your doctor and return if any problems.

## 2018-05-01 NOTE — ED Provider Notes (Signed)
[facility]-EMERGENCY DEPT Provider Note   CSN: [id number] Arrival date & time: 05/01/18  1938     History   Chief Complaint Chief Complaint  Patient presents with  . Insect Bite    HPI Nicholas Lawson is a 42 y.o. male.  He states he was cutting grass in the yard when he experienced multiple bee stings through his trunk and arms.  It did not bother him too much and he continued to cut the grass but then he started itching all over so he went inside took a shower.  He continued to itch and have hives and so he presented to the emergency department.  At triage she was about an hour to an hour and a half after the bee stings.  He was immediately evaluated given subcu epi.  Is brought back to the acute care exam area.  Currently he is complaining of some itching all over but no difficulty speaking or swallowing or breathing.  He had bee stings before and never had a reaction like this before  The history is provided by the patient.  Allergic Reaction  Presenting symptoms: itching and rash   Presenting symptoms: no wheezing   Severity:  Moderate Duration:  1 hour Prior allergic episodes:  No prior episodes Context: insect bite/sting   Relieved by:  None tried Worsened by:  Nothing Ineffective treatments:  None tried   History reviewed. No pertinent past medical history.  Patient Active Problem List   Diagnosis Date Noted  . Back pain 09/07/2014    Past Surgical History:  Procedure Laterality Date  . NO PAST SURGERIES          Home Medications    Prior to Admission medications   Medication Sig Start Date End Date Taking? Authorizing Provider  cyclobenzaprine (FLEXERIL) 10 MG tablet Take 1 tablet (10 mg total) by mouth 2 (two) times daily as needed for muscle spasms. 10/29/14   Teressa LowerPickering, Vrinda, NP  HYDROcodone-acetaminophen (NORCO/VICODIN) 5-325 MG per tablet Take 2 tablets by mouth every 4 (four) hours as needed. 10/29/14   Teressa LowerPickering, Vrinda, NP   predniSONE (DELTASONE) 10 MG tablet 4 tablets x 2 days, 3 tabs x 2 days, 2 tabs x 2 days, 1 tab x 2 days 09/07/14   Wanda PlumpPaz, Jose E, MD    Family History Family History  Problem Relation Age of Onset  . Hypertension Mother   . Hypertension Father   . CAD Unknown   . Colon cancer Neg Hx   . Prostate cancer Neg Hx     Social History Social History   Tobacco Use  . Smoking status: Current Every Day Smoker  . Smokeless tobacco: Never Used  . Tobacco comment: 1/2 ppd  Substance Use Topics  . Alcohol use: Yes  . Drug use: Never     Allergies   Bee venom   Review of Systems Review of Systems  Constitutional: Negative for fever.  HENT: Negative for facial swelling and sore throat.   Eyes: Negative for visual disturbance.  Respiratory: Negative for choking, chest tightness, shortness of breath, wheezing and stridor.   Cardiovascular: Negative for chest pain.  Gastrointestinal: Negative for abdominal pain, nausea and vomiting.  Genitourinary: Negative for dysuria.  Skin: Positive for itching and rash.  Neurological: Negative for syncope and numbness.     Physical Exam Updated Vital Signs BP (!) 140/93 (BP Location: Right Arm)   Pulse (!) 136   Temp 97.7 F (36.5 C) (Oral)  Resp 20   Ht 6' (1.829 m)   Wt 82.6 kg (182 lb)   SpO2 96%   BMI 24.68 kg/m   Physical Exam  Constitutional: He is oriented to person, place, and time. He appears well-developed and well-nourished.  HENT:  Head: Normocephalic and atraumatic.  Right Ear: External ear normal.  Left Ear: External ear normal.  Nose: Nose normal.  Mouth/Throat: Oropharynx is clear and moist.  Eyes: Pupils are equal, round, and reactive to light. Conjunctivae and EOM are normal.  Neck: Trachea normal and phonation normal. Neck supple.  Cardiovascular: Regular rhythm. Tachycardia present.  Pulmonary/Chest: Effort normal and breath sounds normal. No respiratory distress. He has no wheezes.  Musculoskeletal: Normal  range of motion. He exhibits no tenderness or deformity.  Neurological: He is alert and oriented to person, place, and time. He has normal strength. No sensory deficit. GCS eye subscore is 4. GCS verbal subscore is 5. GCS motor subscore is 6.  Skin: Skin is warm and intact. Capillary refill takes less than 2 seconds. Rash noted. Rash is urticarial. He is diaphoretic.  Psychiatric: He has a normal mood and affect.  Nursing note and vitals reviewed.    ED Treatments / Results  Labs (all labs ordered are listed, but only abnormal results are displayed) Labs Reviewed - No data to display  EKG None  Radiology No results found.  Procedures Procedures (including critical care time)  Medications Ordered in ED Medications  EPINEPHrine (EPI-PEN) injection 0.3 mg (0.3 mg Intramuscular Given 05/01/18 2002)  methylPREDNISolone sodium succinate (SOLU-MEDROL) 125 mg/2 mL injection 125 mg (125 mg Intravenous Given 05/01/18 2009)  diphenhydrAMINE (BENADRYL) injection 25 mg (25 mg Intravenous Given 05/01/18 2012)     Initial Impression / Assessment and Plan / ED Course  I have reviewed the triage vital signs and the nursing notes.  Pertinent labs & imaging results that were available during my care of the patient were reviewed by me and considered in my medical decision making (see chart for details).  Clinical Course as of May 02 1022  Fri May 01, 2018  2123 Reevaluated itching improved patient feels basically back to baseline.  We will continue to observe.   [MB]    Clinical Course User Index [MB] Terrilee Files, MD    Observed for for almost 4 hours with no recurrence of sx. Will discharge with steroids and epipen.   Final Clinical Impressions(s) / ED Diagnoses   Final diagnoses:  Allergic reaction to bee sting    ED Discharge Orders        Ordered    predniSONE (DELTASONE) 20 MG tablet  Daily     05/01/18 2305    EPINEPHrine (EPIPEN 2-PAK) 0.3 mg/0.3 mL IJ SOAJ injection    Once     05/01/18 2305       Terrilee Files, MD 05/02/18 1025

## 2018-05-01 NOTE — ED Triage Notes (Signed)
Pt reports that he was stung by multiple bees, and [presents with hives and itching all over his body. Pt reports that bee stung him on his rt arm. No sob no tongue swelling is noted onset occurred 1 hour ago.
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
## 2020-02-02 ENCOUNTER — Other Ambulatory Visit: Payer: Self-pay

## 2020-02-02 ENCOUNTER — Emergency Department (HOSPITAL_COMMUNITY)
Admission: EM | Admit: 2020-02-02 | Discharge: 2020-02-02 | Disposition: A | Discharge status: Home / Self Care | Payer: BLUE CROSS/BLUE SHIELD | Attending: Emergency Medicine | Admitting: Emergency Medicine

## 2020-02-02 ENCOUNTER — Encounter (HOSPITAL_COMMUNITY): Payer: Self-pay | Admitting: Emergency Medicine

## 2020-02-02 ENCOUNTER — Emergency Department (HOSPITAL_COMMUNITY): Payer: BLUE CROSS/BLUE SHIELD

## 2020-02-02 DIAGNOSIS — R030 Elevated blood-pressure reading, without diagnosis of hypertension: Secondary | ICD-10-CM | POA: Diagnosis not present

## 2020-02-02 DIAGNOSIS — R079 Chest pain, unspecified: Secondary | ICD-10-CM | POA: Diagnosis present

## 2020-02-02 DIAGNOSIS — R0789 Other chest pain: Secondary | ICD-10-CM | POA: Insufficient documentation

## 2020-02-02 DIAGNOSIS — F17200 Nicotine dependence, unspecified, uncomplicated: Secondary | ICD-10-CM | POA: Diagnosis not present

## 2020-02-02 LAB — BASIC METABOLIC PANEL
Anion gap: 6 (ref 5–15)
BUN: 14 mg/dL (ref 6–20)
CO2: 29 mmol/L (ref 22–32)
Calcium: 9.1 mg/dL (ref 8.9–10.3)
Chloride: 107 mmol/L (ref 98–111)
Creatinine, Ser: 0.71 mg/dL (ref 0.61–1.24)
GFR calc Af Amer: >60 mL/min (ref >60)
GFR calc non Af Amer: >60 mL/min (ref >60)
Glucose, Bld: 84 mg/dL (ref 70–99)
Potassium: 4.3 mmol/L (ref 3.5–5.1)
Sodium: 142 mmol/L (ref 135–145)

## 2020-02-02 LAB — CBC
HCT: 45.4 % (ref 39.0–52.0)
Hemoglobin: 14.9 g/dL (ref 13.0–17.0)
MCH: 32.3 pg (ref 26.0–34.0)
MCHC: 32.8 g/dL (ref 30.0–36.0)
MCV: 98.5 fL (ref 80.0–100.0)
Platelets: 269 10*3/uL (ref 150–400)
RBC: 4.61 MIL/uL (ref 4.22–5.81)
RDW: 13.8 % (ref 11.5–15.5)
WBC: 3.8 10*3/uL — ABNORMAL LOW (ref 4.0–10.5)
nRBC: 0 % (ref 0.0–0.2)

## 2020-02-02 LAB — TROPONIN I (HIGH SENSITIVITY)
Troponin I (High Sensitivity): <2 ng/L (ref <18)
Troponin I (High Sensitivity): <2 ng/L (ref <18)

## 2020-02-02 LAB — D-DIMER, QUANTITATIVE: D-Dimer, Quant: <0.27 ug/mL-FEU (ref 0.00–0.50)

## 2020-02-02 IMAGING — CR DG CHEST 2V
2 series · 2 of 2 positions shown · non-contrast
Comparison: None.

CLINICAL DATA: Chest pain. Current smoker.

EXAM:
CHEST - 2 VIEW

[w chest pa]
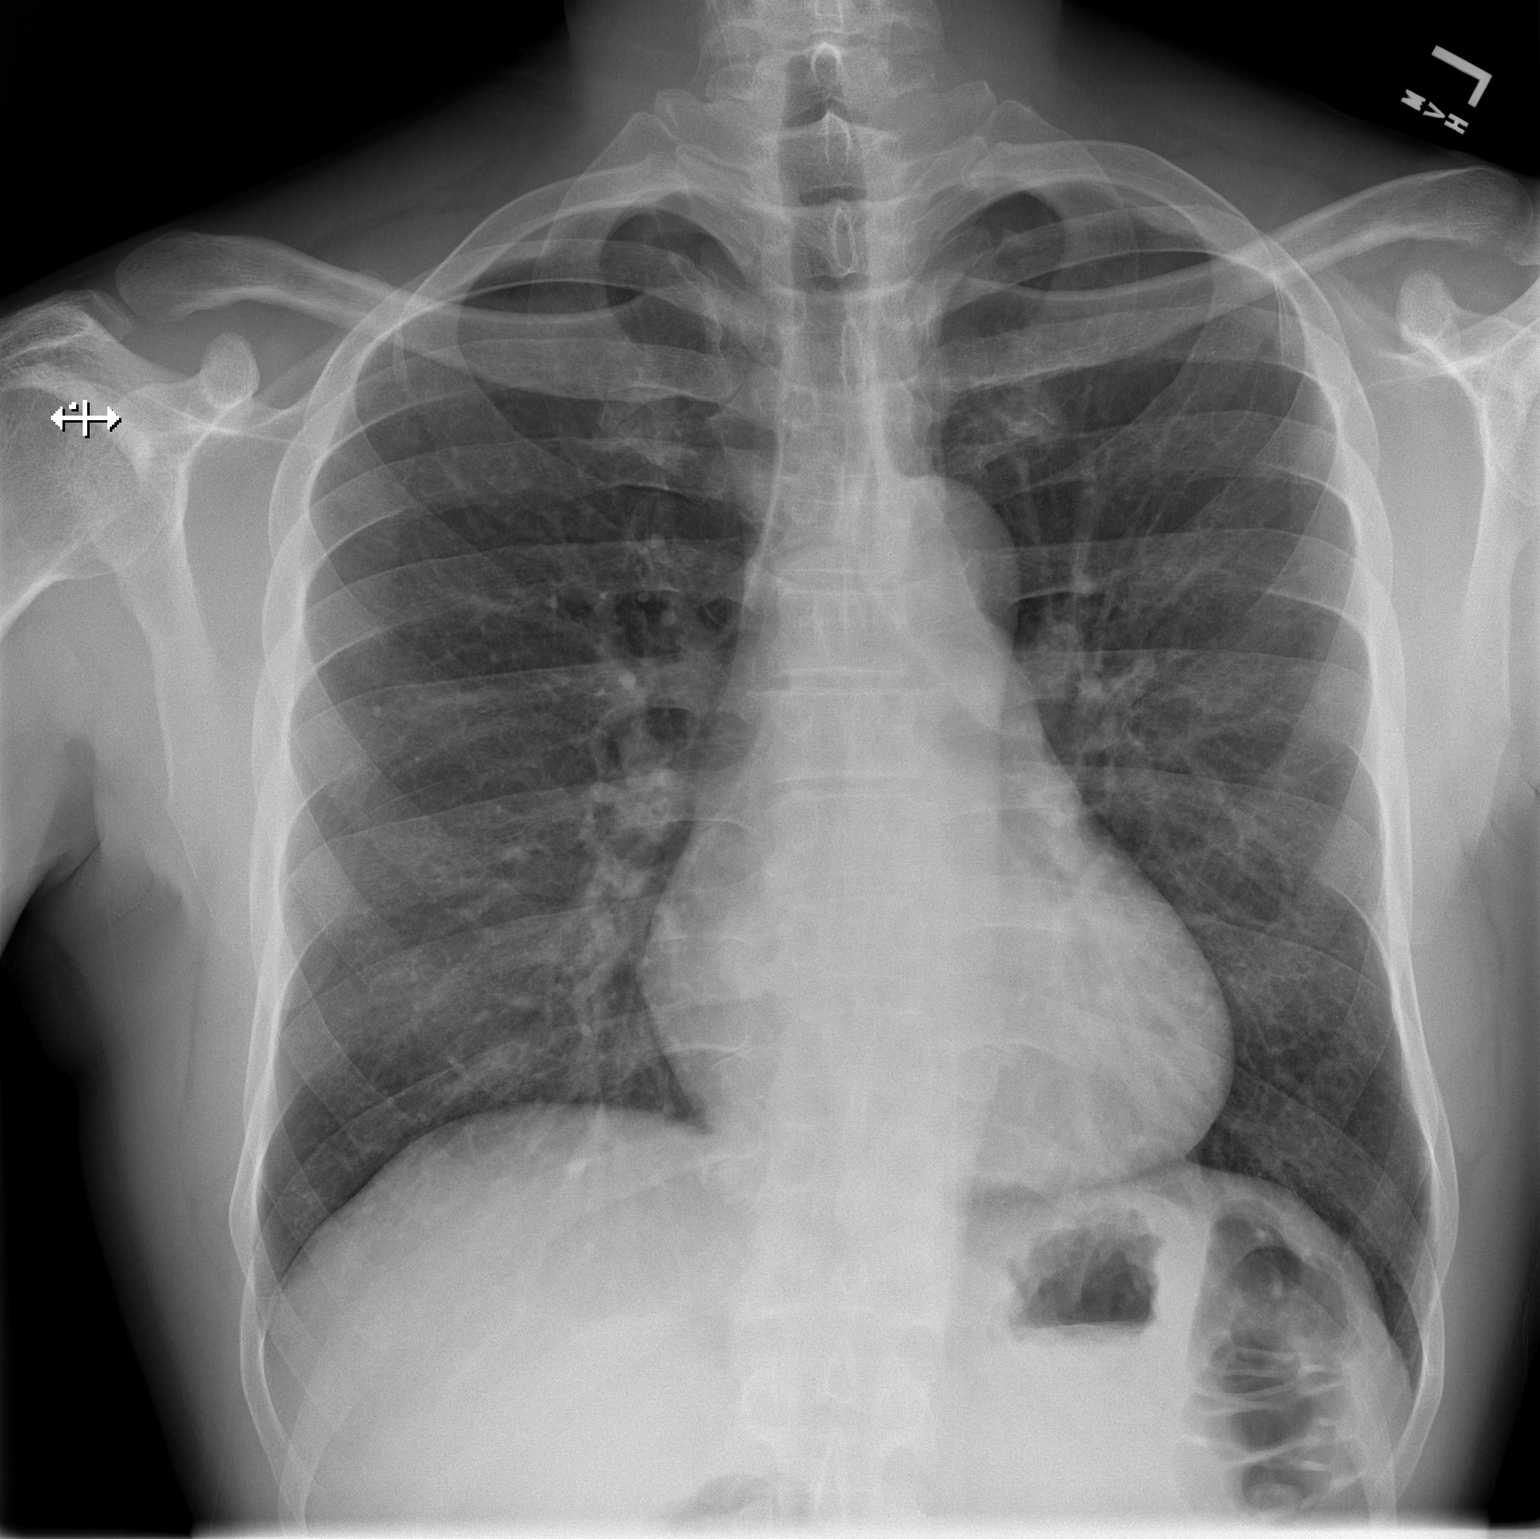

[w chest lat]
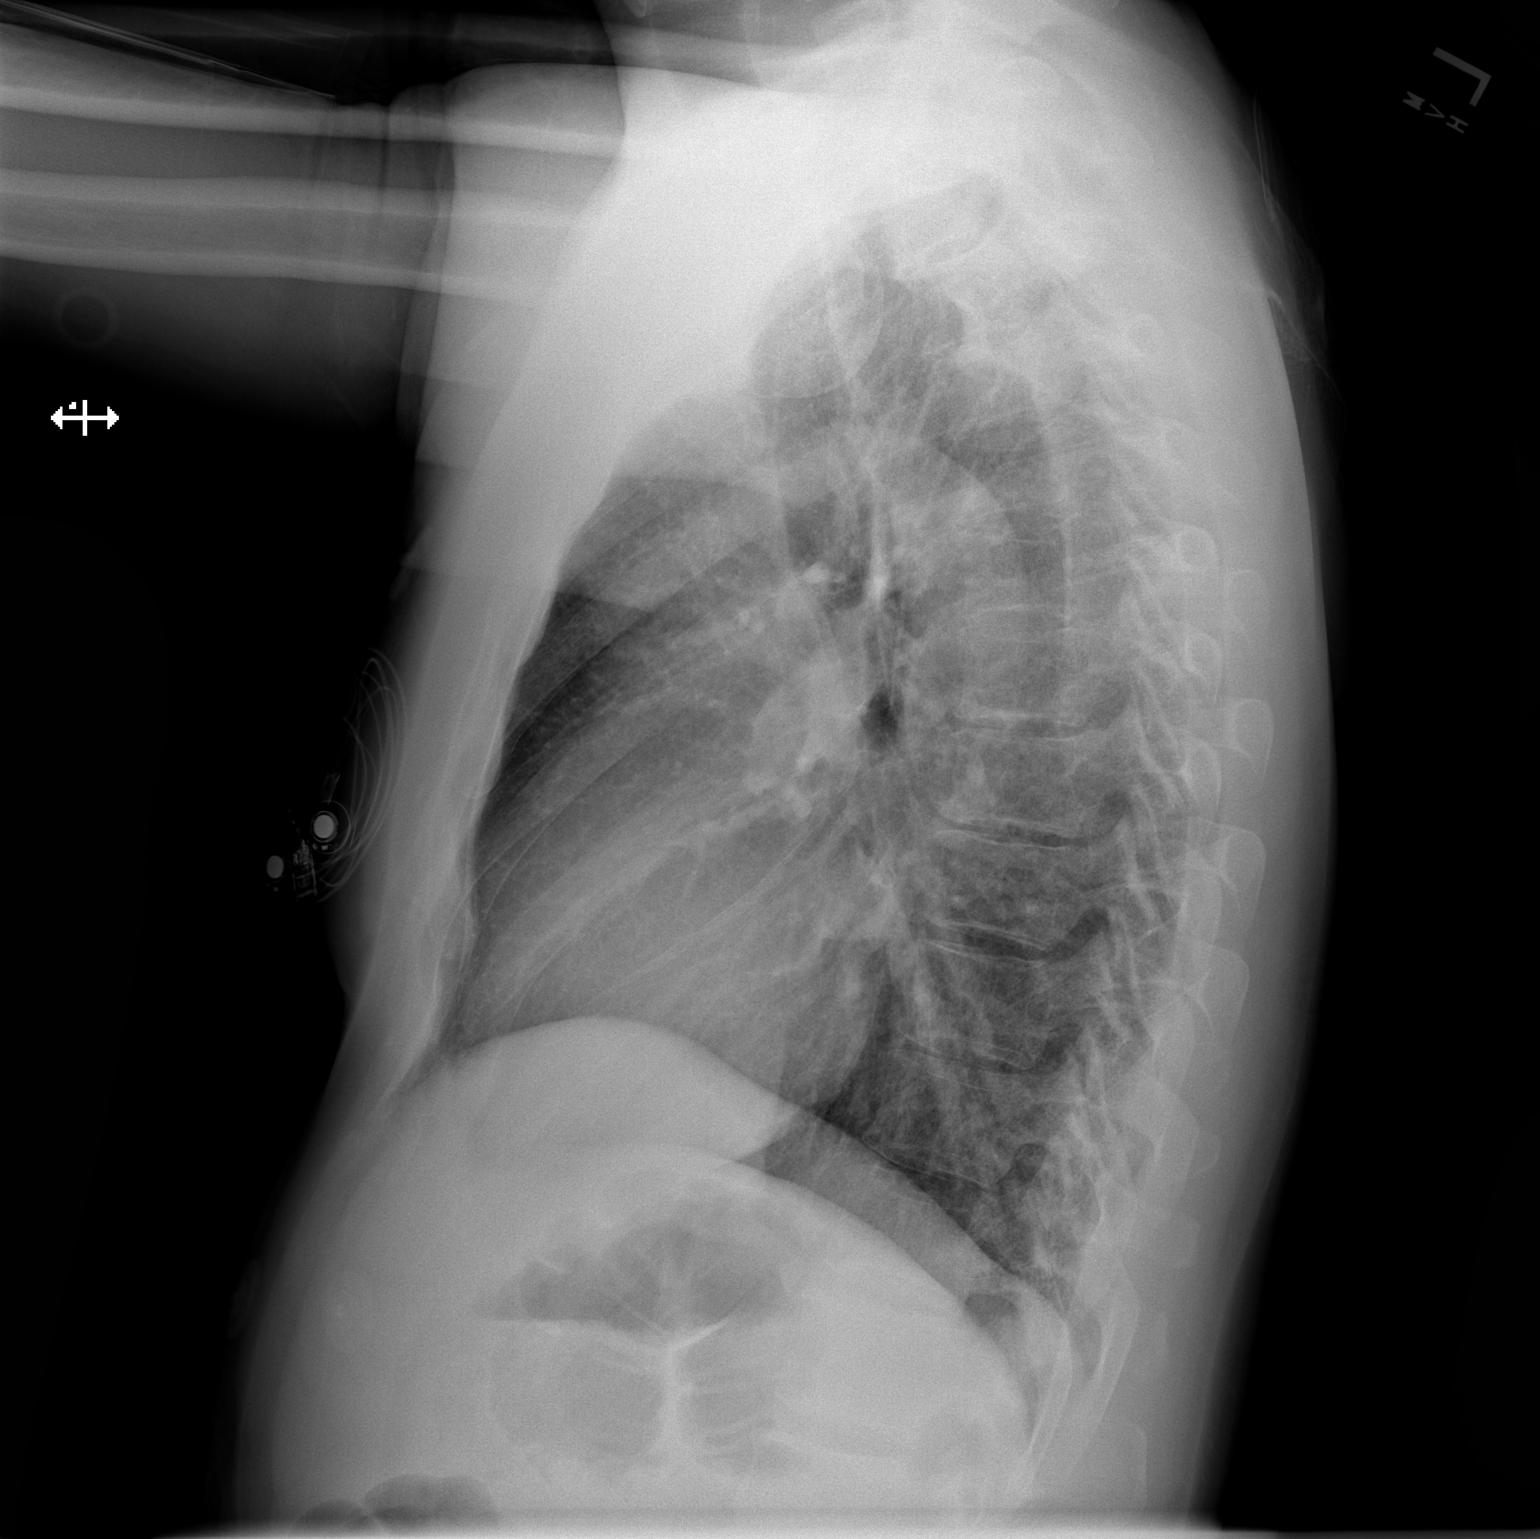

[2 of 2 positions shown; findings below may reference images not displayed]

FINDINGS: Normal cardiomediastinal silhouette. Clear lungs without pleural
fluid or pneumothorax. No interstitial edema. Mild spinal
levocurvature.
IMPRESSION: Negative for acute cardiopulmonary disease.

## 2020-02-02 MED ORDER — IBUPROFEN 800 MG PO TABS: 800.0000 mg | ORAL_TABLET | Freq: Once | ORAL | Status: DC

## 2020-02-02 NOTE — ED Triage Notes (Signed)
Patient c/o left side chest pain with SOB today. Denies cough, N/V/D.

## 2020-02-02 NOTE — ED Provider Notes (Signed)
[facility]-EMERGENCY DEPT Provider Note   CSN: [id number] Arrival date & time: 02/02/20  1749     History Chief Complaint  Patient presents with  . Chest Pain    Nicholas Lawson is a 44 y.o. male.  The history is provided by the patient and medical records. No language interpreter was used.  Chest Pain  Nicholas Lawson is a 44 y.o. male who presents to the Emergency Department complaining of chest pain.  He presents to the ED complaining of left sided chest pain that started this morning.  Pain is constant and squeezing, located in the left chest.  It is non radiating.  It is worse with deep breaths.  Denies fevers, cough, N/V, abdominal pain, leg swelling/pain.  He is sob.  Has no known medical problems, takes no medicine.   Quit smoking 6 months ago.  Occasional alcohol.  No street drugs.    Family hx of HTN.  No family hx of heart disease or blood clots.      History reviewed. No pertinent past medical history.  Patient Active Problem List   Diagnosis Date Noted  . Back pain 09/07/2014    Past Surgical History:  Procedure Laterality Date  . NO PAST SURGERIES         Family History  Problem Relation Age of Onset  . Hypertension Mother   . Hypertension Father   . CAD Other   . Colon cancer Neg Hx   . Prostate cancer Neg Hx     Social History   Tobacco Use  . Smoking status: Current Every Day Smoker  . Smokeless tobacco: Never Used  . Tobacco comment: 1/2 ppd  Substance Use Topics  . Alcohol use: Yes  . Drug use: Never    Home Medications Prior to Admission medications   Medication Sig Start Date End Date Taking? Authorizing Provider  cyclobenzaprine (FLEXERIL) 10 MG tablet Take 1 tablet (10 mg total) by mouth 2 (two) times daily as needed for muscle spasms. Patient not taking: Reported on 02/02/2020 10/29/14   Teressa Lower, NP  HYDROcodone-acetaminophen (NORCO/VICODIN) 5-325 MG per tablet Take 2 tablets by mouth every 4  (four) hours as needed. Patient not taking: Reported on 02/02/2020 10/29/14   Teressa Lower, NP  predniSONE (DELTASONE) 20 MG tablet Take 3 tablets (60 mg total) by mouth daily. Patient not taking: Reported on 02/02/2020 05/01/18   Terrilee Files, MD    Allergies    Bee venom  Review of Systems   Review of Systems  Cardiovascular: Positive for chest pain.  All other systems reviewed and are negative.   Physical Exam Updated Vital Signs BP (!) 158/106   Pulse 64   Temp 98.1 F (36.7 C) (Oral)   Resp 16   SpO2 100%   Physical Exam Vitals and nursing note reviewed.  Constitutional:      Appearance: He is well-developed.  HENT:     Head: Normocephalic and atraumatic.  Cardiovascular:     Rate and Rhythm: Normal rate and regular rhythm.     Heart sounds: No murmur.  Pulmonary:     Effort: Pulmonary effort is normal. No respiratory distress.     Breath sounds: Normal breath sounds.  Chest:     Chest wall: No tenderness.  Abdominal:     Palpations: Abdomen is soft.     Tenderness: There is no abdominal tenderness. There is no guarding or rebound.  Musculoskeletal:  General: No swelling or tenderness.  Skin:    General: Skin is warm and dry.  Neurological:     Mental Status: He is alert and oriented to person, place, and time.  Psychiatric:        Behavior: Behavior normal.     ED Results / Procedures / Treatments   Labs (all labs ordered are listed, but only abnormal results are displayed) Labs Reviewed  CBC - Abnormal; Notable for the following components:      Result Value   WBC 3.8 (*)    All other components within normal limits  BASIC METABOLIC PANEL  D-DIMER, QUANTITATIVE (NOT AT [facility])  D-DIMER, QUANTITATIVE (NOT AT [facility])  TROPONIN I (HIGH SENSITIVITY)  TROPONIN I (HIGH SENSITIVITY)    EKG None  Radiology DG Chest 2 View  Result Date: 02/02/2020 CLINICAL DATA:  Chest pain. Current smoker. EXAM: CHEST - 2 VIEW COMPARISON:  None.  FINDINGS: Normal cardiomediastinal silhouette. Clear lungs without pleural fluid or pneumothorax. No interstitial edema. Mild spinal levocurvature. IMPRESSION: Negative for acute cardiopulmonary disease. Electronically Signed   By: Revonda Humphrey   On: 02/02/2020 19:21    Procedures Procedures (including critical care time)  Medications Ordered in ED Medications  ibuprofen (ADVIL) tablet 800 mg (800 mg Oral Refused 02/02/20 2345)    ED Course  I have reviewed the triage vital signs and the nursing notes.  Pertinent labs & imaging results that were available during my care of the patient were reviewed by me and considered in my medical decision making (see chart for details).    MDM Rules/Calculators/A&P                     Patient here for evaluation of left-sided chest pain. EKG without acute ischemic changes. Troponin is negative times two. Doubt PE, D dimer is negative. Discussed with patient unclear source of symptoms. Discussed outpatient follow-up and return precautions.  BP is mildly elevated, presentation is not c/w hypertensive urgency.  Discussed outpatient follow up for BP recheck.    Final Clinical Impression(s) / ED Diagnoses Final diagnoses:  Atypical chest pain  Elevated blood pressure reading in office without diagnosis of hypertension    Rx / DC Orders ED Discharge Orders    None       Quintella Reichert, MD 02/03/20 0008

## 2020-11-28 ENCOUNTER — Encounter (HOSPITAL_COMMUNITY): Payer: Self-pay

## 2020-11-28 ENCOUNTER — Other Ambulatory Visit: Payer: Self-pay

## 2020-11-28 ENCOUNTER — Emergency Department (HOSPITAL_COMMUNITY)
Admission: EM | Admit: 2020-11-28 | Discharge: 2020-11-29 | Disposition: A | Discharge status: Home / Self Care | Payer: Worker's Compensation | Attending: Emergency Medicine | Admitting: Emergency Medicine

## 2020-11-28 ENCOUNTER — Emergency Department (HOSPITAL_COMMUNITY): Payer: Worker's Compensation

## 2020-11-28 DIAGNOSIS — S62002A Unspecified fracture of navicular [scaphoid] bone of left wrist, initial encounter for closed fracture: Secondary | ICD-10-CM | POA: Diagnosis not present

## 2020-11-28 DIAGNOSIS — S6992XA Unspecified injury of left wrist, hand and finger(s), initial encounter: Secondary | ICD-10-CM | POA: Diagnosis present

## 2020-11-28 DIAGNOSIS — F17200 Nicotine dependence, unspecified, uncomplicated: Secondary | ICD-10-CM | POA: Diagnosis not present

## 2020-11-28 DIAGNOSIS — Y92410 Unspecified street and highway as the place of occurrence of the external cause: Secondary | ICD-10-CM | POA: Insufficient documentation

## 2020-11-28 DIAGNOSIS — M25531 Pain in right wrist: Secondary | ICD-10-CM
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
## 2020-11-28 MED ORDER — LIDOCAINE 5 % EX PTCH
1.0000 | MEDICATED_PATCH | Freq: Once | CUTANEOUS | Status: DC
  Administered 2020-11-28: 1 via TRANSDERMAL
  Filled 2020-11-28: qty 1

## 2020-11-28 MED ORDER — ACETAMINOPHEN 325 MG PO TABS
650.0000 mg | ORAL_TABLET | Freq: Once | ORAL | Status: AC
  Administered 2020-11-28: 650 mg via ORAL
  Filled 2020-11-28: qty 2
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
## 2020-11-29 NOTE — Progress Notes (Signed)
Orthopedic Tech Progress Note Patient Details:  Nicholas Lawson Dec 07, 1975 [id number]  Ortho Devices Type of Ortho Device: Thumb velcro splint,Thumb spica splint Splint Material: Fiberglass Ortho Device/Splint Location: LUE,RUE Ortho Device/Splint Interventions: Ordered,Application,Adjustment   Post Interventions Patient Tolerated: Well Instructions Provided: Care of device,Adjustment of device,Poper ambulation with device   Nicholas Lawson 11/29/2020, 1:19 AM

## 2021-01-25 ENCOUNTER — Encounter (HOSPITAL_COMMUNITY): Payer: Self-pay

## 2021-01-25 ENCOUNTER — Emergency Department (HOSPITAL_COMMUNITY)
Admission: EM | Admit: 2021-01-25 | Discharge: 2021-01-26 | Disposition: A | Discharge status: Home / Self Care | Payer: BLUE CROSS/BLUE SHIELD | Attending: Emergency Medicine | Admitting: Emergency Medicine

## 2021-01-25 ENCOUNTER — Other Ambulatory Visit: Payer: Self-pay

## 2021-01-25 DIAGNOSIS — M25532 Pain in left wrist: Secondary | ICD-10-CM | POA: Insufficient documentation

## 2021-01-25 DIAGNOSIS — M542 Cervicalgia: Secondary | ICD-10-CM | POA: Diagnosis not present

## 2021-01-25 DIAGNOSIS — F17200 Nicotine dependence, unspecified, uncomplicated: Secondary | ICD-10-CM | POA: Diagnosis not present

## 2021-01-25 DIAGNOSIS — M25511 Pain in right shoulder: Secondary | ICD-10-CM | POA: Insufficient documentation

## 2021-01-25 DIAGNOSIS — T148XXA Other injury of unspecified body region, initial encounter: Secondary | ICD-10-CM | POA: Insufficient documentation

## 2021-01-25 DIAGNOSIS — T07XXXA Unspecified multiple injuries, initial encounter: Secondary | ICD-10-CM
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
## 2021-01-26 ENCOUNTER — Emergency Department (HOSPITAL_COMMUNITY): Payer: BLUE CROSS/BLUE SHIELD
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
## 2021-01-26 MED ORDER — NAPROXEN 500 MG PO TABS: 500.0000 mg | ORAL_TABLET | Freq: Two times a day (BID) | ORAL | 0 refills | Status: DC | PRN

## 2021-01-26 MED ORDER — IBUPROFEN 800 MG PO TABS
800.0000 mg | ORAL_TABLET | Freq: Once | ORAL | Status: AC
  Administered 2021-01-26: 800 mg via ORAL
  Filled 2021-01-26: qty 1
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
## 2021-01-26 NOTE — Discharge Instructions (Signed)
Your x-rays are negative.  Wear the splint on your wrist to protect it as there may be a small fracture that is not yet visible.  Follow-up with the hand surgeon.  Return to the ED with worsening pain, numbness, tingling, any other concerns.

## 2021-02-22 ENCOUNTER — Other Ambulatory Visit: Payer: Self-pay | Admitting: Orthopedic Surgery

## 2021-02-22 DIAGNOSIS — M25532 Pain in left wrist: Secondary | ICD-10-CM

## 2021-03-11 ENCOUNTER — Ambulatory Visit
Admission: RE | Admit: 2021-03-11 | Discharge: 2021-03-11 | Disposition: A | Discharge status: Home / Self Care | Payer: Self-pay | Source: Ambulatory Visit | Attending: Orthopedic Surgery | Admitting: Orthopedic Surgery

## 2021-03-11 ENCOUNTER — Other Ambulatory Visit: Payer: Self-pay

## 2021-03-11 DIAGNOSIS — M25532 Pain in left wrist: Secondary | ICD-10-CM
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
## 2021-04-08 ENCOUNTER — Encounter (HOSPITAL_COMMUNITY): Payer: Self-pay | Admitting: Emergency Medicine

## 2021-04-08 ENCOUNTER — Emergency Department (HOSPITAL_COMMUNITY)
Admission: EM | Admit: 2021-04-08 | Discharge: 2021-04-08 | Disposition: A | Discharge status: Home / Self Care | Payer: BLUE CROSS/BLUE SHIELD | Attending: Emergency Medicine | Admitting: Emergency Medicine

## 2021-04-08 ENCOUNTER — Other Ambulatory Visit: Payer: Self-pay

## 2021-04-08 DIAGNOSIS — R5383 Other fatigue: Secondary | ICD-10-CM | POA: Insufficient documentation

## 2021-04-08 DIAGNOSIS — M791 Myalgia, unspecified site: Secondary | ICD-10-CM | POA: Insufficient documentation

## 2021-04-08 DIAGNOSIS — Z5321 Procedure and treatment not carried out due to patient leaving prior to being seen by health care provider: Secondary | ICD-10-CM | POA: Insufficient documentation

## 2021-04-08 DIAGNOSIS — R519 Headache, unspecified: Secondary | ICD-10-CM | POA: Insufficient documentation

## 2021-04-08 LAB — URINALYSIS, ROUTINE W REFLEX MICROSCOPIC
Bilirubin Urine: NEGATIVE
Glucose, UA: NEGATIVE mg/dL
Hgb urine dipstick: NEGATIVE
Ketones, ur: NEGATIVE mg/dL
Leukocytes,Ua: NEGATIVE
Nitrite: NEGATIVE
Protein, ur: NEGATIVE mg/dL
Specific Gravity, Urine: 1.025 (ref 1.005–1.030)
pH: 5 (ref 5.0–8.0)

## 2021-04-08 LAB — COMPREHENSIVE METABOLIC PANEL
ALT: 27 U/L (ref 0–44)
AST: 21 U/L (ref 15–41)
Albumin: 4.3 g/dL (ref 3.5–5.0)
Alkaline Phosphatase: 74 U/L (ref 38–126)
Anion gap: 7 (ref 5–15)
BUN: 11 mg/dL (ref 6–20)
CO2: 23 mmol/L (ref 22–32)
Calcium: 9.2 mg/dL (ref 8.9–10.3)
Chloride: 107 mmol/L (ref 98–111)
Creatinine, Ser: 0.75 mg/dL (ref 0.61–1.24)
GFR, Estimated: >60 mL/min (ref >60)
Glucose, Bld: 102 mg/dL — ABNORMAL HIGH (ref 70–99)
Potassium: 3.7 mmol/L (ref 3.5–5.1)
Sodium: 137 mmol/L (ref 135–145)
Total Bilirubin: 0.7 mg/dL (ref 0.3–1.2)
Total Protein: 7.9 g/dL (ref 6.5–8.1)

## 2021-04-08 LAB — CBC WITH DIFFERENTIAL/PLATELET
Abs Immature Granulocytes: 0.03 10*3/uL (ref 0.00–0.07)
Basophils Absolute: 0 10*3/uL (ref 0.0–0.1)
Basophils Relative: 0 %
Eosinophils Absolute: 0 10*3/uL (ref 0.0–0.5)
Eosinophils Relative: 0 %
HCT: 42.4 % (ref 39.0–52.0)
Hemoglobin: 14.2 g/dL (ref 13.0–17.0)
Immature Granulocytes: 1 %
Lymphocytes Relative: 3 %
Lymphs Abs: 0.2 10*3/uL — ABNORMAL LOW (ref 0.7–4.0)
MCH: 31.2 pg (ref 26.0–34.0)
MCHC: 33.5 g/dL (ref 30.0–36.0)
MCV: 93.2 fL (ref 80.0–100.0)
Monocytes Absolute: 0.7 10*3/uL (ref 0.1–1.0)
Monocytes Relative: 12 %
Neutro Abs: 4.7 10*3/uL (ref 1.7–7.7)
Neutrophils Relative %: 84 %
Platelets: 248 10*3/uL (ref 150–400)
RBC: 4.55 MIL/uL (ref 4.22–5.81)
RDW: 12.9 % (ref 11.5–15.5)
WBC: 5.6 10*3/uL (ref 4.0–10.5)
nRBC: 0 % (ref 0.0–0.2)

## 2021-04-08 NOTE — ED Notes (Signed)
No answer x3 for vitals reassessment in ED lobby. [employer]

## 2021-04-08 NOTE — ED Triage Notes (Signed)
Patient states he thinks he has COVID, has been around someone with COVID, generalized body aches and headache, fatigue since yesterday.

## 2021-04-08 NOTE — ED Provider Notes (Signed)
Emergency Medicine Provider Triage Evaluation Note  Nicholas Lawson , a 45 y.o. male  was evaluated in triage.  Pt complains of concern for COVID, body aches, chills, exposed to COVID. Symptom onset yesterday, worse today.  No history of asthma or chronic lung disease. Smoker.  Review of Systems  Positive: Fatigue, headache, body aches, chills Negative: Vomiting, cough  Physical Exam  BP (!) 108/96 (BP Location: Left Arm)   Pulse (!) 107   Temp 99.2 F (37.3 C) (Oral)   Resp 18   SpO2 100%  Gen:   Awake, no distress   Resp:  Normal effort  MSK:   Moves extremities without difficulty  Other:    Medical Decision Making  Medically screening exam initiated at 4:52 PM.  Appropriate orders placed.  Nicholas Lawson was informed that the remainder of the evaluation will be completed by another provider, this initial triage assessment does not replace that evaluation, and the importance of remaining in the ED until their evaluation is complete.     Nicholas Fend, PA-C 04/08/21 1653    Arby Barrette, MD 04/23/21 [phone]

## 2021-08-20 ENCOUNTER — Other Ambulatory Visit: Payer: Self-pay | Admitting: Orthopedic Surgery

## 2021-08-20 DIAGNOSIS — M25511 Pain in right shoulder: Secondary | ICD-10-CM

## 2021-08-26 ENCOUNTER — Ambulatory Visit
Admission: RE | Admit: 2021-08-26 | Discharge: 2021-08-26 | Disposition: A | Discharge status: Home / Self Care | Payer: Self-pay | Source: Ambulatory Visit | Attending: Orthopedic Surgery | Admitting: Orthopedic Surgery

## 2021-08-26 ENCOUNTER — Other Ambulatory Visit: Payer: Self-pay

## 2021-08-26 DIAGNOSIS — M25511 Pain in right shoulder: Secondary | ICD-10-CM
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
## 2022-07-23 ENCOUNTER — Encounter: Payer: Self-pay | Admitting: Medical

## 2022-07-23 ENCOUNTER — Ambulatory Visit (INDEPENDENT_AMBULATORY_CARE_PROVIDER_SITE_OTHER): Payer: BLUE CROSS/BLUE SHIELD | Admitting: Medical

## 2022-07-23 ENCOUNTER — Encounter: Payer: Self-pay | Admitting: Gastroenterology

## 2022-07-23 VITALS — BP 140/86 | HR 90 | Temp 98.0°F | Resp 18 | Ht 72.0 in | Wt 195.0 lb

## 2022-07-23 DIAGNOSIS — Z1211 Encounter for screening for malignant neoplasm of colon: Secondary | ICD-10-CM | POA: Diagnosis not present

## 2022-07-23 DIAGNOSIS — R030 Elevated blood-pressure reading, without diagnosis of hypertension: Secondary | ICD-10-CM

## 2022-07-23 DIAGNOSIS — F17200 Nicotine dependence, unspecified, uncomplicated: Secondary | ICD-10-CM

## 2022-07-23 DIAGNOSIS — Z125 Encounter for screening for malignant neoplasm of prostate: Secondary | ICD-10-CM | POA: Diagnosis not present

## 2022-07-23 DIAGNOSIS — R5383 Other fatigue: Secondary | ICD-10-CM | POA: Diagnosis not present

## 2022-07-23 DIAGNOSIS — Z23 Encounter for immunization: Secondary | ICD-10-CM

## 2022-07-23 DIAGNOSIS — Z0000 Encounter for general adult medical examination without abnormal findings: Secondary | ICD-10-CM

## 2022-07-23 LAB — CBC WITH DIFFERENTIAL/PLATELET
Basophils Absolute: 0 10*3/uL (ref 0.0–0.1)
Basophils Relative: 0.9 % (ref 0.0–3.0)
Eosinophils Absolute: 0.1 10*3/uL (ref 0.0–0.7)
Eosinophils Relative: 3.6 % (ref 0.0–5.0)
HCT: 42.6 % (ref 39.0–52.0)
Hemoglobin: 14.3 g/dL (ref 13.0–17.0)
Lymphocytes Relative: 37.2 % (ref 12.0–46.0)
Lymphs Abs: 1.2 10*3/uL (ref 0.7–4.0)
MCHC: 33.6 g/dL (ref 30.0–36.0)
MCV: 93.8 fl (ref 78.0–100.0)
Monocytes Absolute: 0.3 10*3/uL (ref 0.1–1.0)
Monocytes Relative: 9.9 % (ref 3.0–12.0)
Neutro Abs: 1.6 10*3/uL (ref 1.4–7.7)
Neutrophils Relative %: 48.4 % (ref 43.0–77.0)
Platelets: 288 10*3/uL (ref 150.0–400.0)
RBC: 4.54 Mil/uL (ref 4.22–5.81)
RDW: 13.9 % (ref 11.5–15.5)
WBC: 3.3 10*3/uL — ABNORMAL LOW (ref 4.0–10.5)

## 2022-07-23 LAB — COMPREHENSIVE METABOLIC PANEL
ALT: 21 U/L (ref 0–53)
AST: 16 U/L (ref 0–37)
Albumin: 4.1 g/dL (ref 3.5–5.2)
Alkaline Phosphatase: 87 U/L (ref 39–117)
BUN: 10 mg/dL (ref 6–23)
CO2: 29 mEq/L (ref 19–32)
Calcium: 9.4 mg/dL (ref 8.4–10.5)
Chloride: 103 mEq/L (ref 96–112)
Creatinine, Ser: 0.71 mg/dL (ref 0.40–1.50)
GFR: 109.55 mL/min (ref >60.00)
Glucose, Bld: 94 mg/dL (ref 70–99)
Potassium: 4.7 mEq/L (ref 3.5–5.1)
Sodium: 139 mEq/L (ref 135–145)
Total Bilirubin: 0.6 mg/dL (ref 0.2–1.2)
Total Protein: 7.2 g/dL (ref 6.0–8.3)

## 2022-07-23 LAB — LIPID PANEL
Cholesterol: 178 mg/dL (ref 0–200)
HDL: 44.6 mg/dL (ref >39.00)
LDL Cholesterol: 118 mg/dL — ABNORMAL HIGH (ref 0–99)
NonHDL: 133.01
Total CHOL/HDL Ratio: 4
Triglycerides: 77 mg/dL (ref 0.0–149.0)
VLDL: 15.4 mg/dL (ref 0.0–40.0)

## 2022-07-23 LAB — PSA: PSA: 1.2 ng/mL (ref 0.10–4.00)

## 2022-07-23 LAB — TSH: TSH: 1.33 u[IU]/mL (ref 0.35–5.50)

## 2022-07-23 LAB — T4, FREE: Free T4: 0.68 ng/dL (ref 0.60–1.60)
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
## 2022-07-23 NOTE — Patient Instructions (Addendum)
For you wellness exam today I have ordered cbc, cmp, lipid panel and hiv.   Fatigue- check tsh, t4 and b12 level.  Will get psa for screening.  Vaccine given today tdap.  Recommend exercise and healthy diet.  We will let you know lab results as they come in.  Do recommend stopping smoking.  Bp is elevated. First time here with me. Recommend get bp cuff over the counter and check every other day at home when relaxed. Send me update reading in 2 weeks. Call or send my chart message.  Follow up date appointment will be determined after lab review.    Preventive Care 87-11 Years Old, Male Preventive care refers to lifestyle choices and visits with your health care provider that can promote health and wellness. Preventive care visits are also called wellness exams. What can I expect for my preventive care visit? Counseling During your preventive care visit, your health care provider may ask about your: Medical history, including: Past medical problems. Family medical history. Current health, including: Emotional well-being. Home life and relationship well-being. Sexual activity. Lifestyle, including: Alcohol, nicotine or tobacco, and drug use. Access to firearms. Diet, exercise, and sleep habits. Safety issues such as seatbelt and bike helmet use. Sunscreen use. Work and work Astronomer. Physical exam Your health care provider will check your: Height and weight. These may be used to calculate your BMI (body mass index). BMI is a measurement that tells if you are at a healthy weight. Waist circumference. This measures the distance around your waistline. This measurement also tells if you are at a healthy weight and may help predict your risk of certain diseases, such as type 2 diabetes and high blood pressure. Heart rate and blood pressure. Body temperature. Skin for abnormal spots. What immunizations do I need?  Vaccines are usually given at various ages, according to a  schedule. Your health care provider will recommend vaccines for you based on your age, medical history, and lifestyle or other factors, such as travel or where you work. What tests do I need? Screening Your health care provider may recommend screening tests for certain conditions. This may include: Lipid and cholesterol levels. Diabetes screening. This is done by checking your blood sugar (glucose) after you have not eaten for a while (fasting). Hepatitis B test. Hepatitis C test. HIV (human immunodeficiency virus) test. STI (sexually transmitted infection) testing, if you are at risk. Lung cancer screening. Prostate cancer screening. Colorectal cancer screening. Talk with your health care provider about your test results, treatment options, and if necessary, the need for more tests. Follow these instructions at home: Eating and drinking  Eat a diet that includes fresh fruits and vegetables, whole grains, lean protein, and low-fat dairy products. Take vitamin and mineral supplements as recommended by your health care provider. Do not drink alcohol if your health care provider tells you not to drink. If you drink alcohol: Limit how much you have to 0-2 drinks a day. Know how much alcohol is in your drink. In the [place] one drink equals one 12 oz bottle of beer (355 mL), one 5 oz glass of wine (148 mL), or one 1 oz glass of hard liquor (44 mL). Lifestyle Brush your teeth every morning and night with fluoride toothpaste. Floss one time each day. Exercise for at least 30 minutes 5 or more days each week. Do not use any products that contain nicotine or tobacco. These products include cigarettes, chewing tobacco, and vaping devices, such as e-cigarettes. If you  need help quitting, ask your health care provider. Do not use drugs. If you are sexually active, practice safe sex. Use a condom or other form of protection to prevent STIs. Take aspirin only as told by your health care provider. Make  sure that you understand how much to take and what form to take. Work with your health care provider to find out whether it is safe and beneficial for you to take aspirin daily. Find healthy ways to manage stress, such as: Meditation, yoga, or listening to music. Journaling. Talking to a trusted person. Spending time with friends and family. Minimize exposure to UV radiation to reduce your risk of skin cancer. Safety Always wear your seat belt while driving or riding in a vehicle. Do not drive: If you have been drinking alcohol. Do not ride with someone who has been drinking. When you are tired or distracted. While texting. If you have been using any mind-altering substances or drugs. Wear a helmet and other protective equipment during sports activities. If you have firearms in your house, make sure you follow all gun safety procedures. What's next? Go to your health care provider once a year for an annual wellness visit. Ask your health care provider how often you should have your eyes and teeth checked. Stay up to date on all vaccines. This information is not intended to replace advice given to you by your health care provider. Make sure you discuss any questions you have with your health care provider. Document Revised: 05/02/2021 Document Reviewed: 05/02/2021 Elsevier Patient Education  2023 [employer].

## 2022-07-23 NOTE — Addendum Note (Signed)
Addended by: Maximino Sarin on: 07/23/2022 09:24 AM   Modules accepted: Orders

## 2022-08-27 ENCOUNTER — Encounter: Payer: BLUE CROSS/BLUE SHIELD | Admitting: Gastroenterology

## 2023-04-26 ENCOUNTER — Encounter (HOSPITAL_COMMUNITY): Payer: Self-pay

## 2023-04-26 ENCOUNTER — Emergency Department (HOSPITAL_COMMUNITY): Payer: BLUE CROSS/BLUE SHIELD

## 2023-04-26 ENCOUNTER — Other Ambulatory Visit: Payer: Self-pay

## 2023-04-26 ENCOUNTER — Inpatient Hospital Stay (HOSPITAL_COMMUNITY)
Admission: EM | Admit: 2023-04-26 | Discharge: 2023-05-01 | DRG: 066 | Disposition: A | Discharge status: Home / Self Care | Payer: BLUE CROSS/BLUE SHIELD | Attending: Neurological Surgery | Admitting: Neurological Surgery

## 2023-04-26 DIAGNOSIS — I615 Nontraumatic intracerebral hemorrhage, intraventricular: Secondary | ICD-10-CM | POA: Diagnosis present

## 2023-04-26 DIAGNOSIS — I10 Essential (primary) hypertension: Secondary | ICD-10-CM | POA: Diagnosis present

## 2023-04-26 DIAGNOSIS — I608 Other nontraumatic subarachnoid hemorrhage: Secondary | ICD-10-CM | POA: Diagnosis present

## 2023-04-26 DIAGNOSIS — R519 Headache, unspecified: Secondary | ICD-10-CM | POA: Diagnosis present

## 2023-04-26 DIAGNOSIS — I6389 Other cerebral infarction: Secondary | ICD-10-CM | POA: Diagnosis not present

## 2023-04-26 LAB — COMPREHENSIVE METABOLIC PANEL
ALT: 22 U/L (ref 0–44)
AST: 21 U/L (ref 15–41)
Albumin: 4.2 g/dL (ref 3.5–5.0)
Alkaline Phosphatase: 95 U/L (ref 38–126)
Anion gap: 7 (ref 5–15)
BUN: 11 mg/dL (ref 6–20)
CO2: 27 mmol/L (ref 22–32)
Calcium: 9.2 mg/dL (ref 8.9–10.3)
Chloride: 102 mmol/L (ref 98–111)
Creatinine, Ser: 0.76 mg/dL (ref 0.61–1.24)
GFR, Estimated: >60 mL/min (ref >60)
Glucose, Bld: 116 mg/dL — ABNORMAL HIGH (ref 70–99)
Potassium: 3.8 mmol/L (ref 3.5–5.1)
Sodium: 136 mmol/L (ref 135–145)
Total Bilirubin: 1 mg/dL (ref 0.3–1.2)
Total Protein: 8.6 g/dL — ABNORMAL HIGH (ref 6.5–8.1)

## 2023-04-26 LAB — I-STAT CHEM 8, ED
BUN: 11 mg/dL (ref 6–20)
Calcium, Ion: 1.23 mmol/L (ref 1.15–1.40)
Chloride: 101 mmol/L (ref 98–111)
Creatinine, Ser: 0.8 mg/dL (ref 0.61–1.24)
Glucose, Bld: 109 mg/dL — ABNORMAL HIGH (ref 70–99)
HCT: 48 % (ref 39.0–52.0)
Hemoglobin: 16.3 g/dL (ref 13.0–17.0)
Potassium: 4.8 mmol/L (ref 3.5–5.1)
Sodium: 138 mmol/L (ref 135–145)
TCO2: 35 mmol/L — ABNORMAL HIGH (ref 22–32)

## 2023-04-26 LAB — CBC WITH DIFFERENTIAL/PLATELET
Abs Immature Granulocytes: 0.01 10*3/uL (ref 0.00–0.07)
Basophils Absolute: 0 10*3/uL (ref 0.0–0.1)
Basophils Relative: 0 %
Eosinophils Absolute: 0 10*3/uL (ref 0.0–0.5)
Eosinophils Relative: 0 %
HCT: 44.7 % (ref 39.0–52.0)
Hemoglobin: 14.4 g/dL (ref 13.0–17.0)
Immature Granulocytes: 0 %
Lymphocytes Relative: 10 %
Lymphs Abs: 0.7 10*3/uL (ref 0.7–4.0)
MCH: 30.3 pg (ref 26.0–34.0)
MCHC: 32.2 g/dL (ref 30.0–36.0)
MCV: 93.9 fL (ref 80.0–100.0)
Monocytes Absolute: 0.2 10*3/uL (ref 0.1–1.0)
Monocytes Relative: 3 %
Neutro Abs: 5.9 10*3/uL (ref 1.7–7.7)
Neutrophils Relative %: 87 %
Platelets: 279 10*3/uL (ref 150–400)
RBC: 4.76 MIL/uL (ref 4.22–5.81)
RDW: 13 % (ref 11.5–15.5)
WBC: 6.8 10*3/uL (ref 4.0–10.5)
nRBC: 0 % (ref 0.0–0.2)

## 2023-04-26 LAB — URINALYSIS, ROUTINE W REFLEX MICROSCOPIC
Bilirubin Urine: NEGATIVE
Glucose, UA: NEGATIVE mg/dL
Hgb urine dipstick: NEGATIVE
Ketones, ur: NEGATIVE mg/dL
Leukocytes,Ua: NEGATIVE
Nitrite: NEGATIVE
Protein, ur: NEGATIVE mg/dL
Specific Gravity, Urine: 1.004 — ABNORMAL LOW (ref 1.005–1.030)
pH: 7 (ref 5.0–8.0)

## 2023-04-26 LAB — PROTIME-INR
INR: 1.1 (ref 0.8–1.2)
Prothrombin Time: 13.9 seconds (ref 11.4–15.2)

## 2023-04-26 LAB — RAPID URINE DRUG SCREEN, HOSP PERFORMED
Amphetamines: NOT DETECTED
Barbiturates: NOT DETECTED
Benzodiazepines: NOT DETECTED
Cocaine: NOT DETECTED
Opiates: POSITIVE — AB
Tetrahydrocannabinol: POSITIVE — AB

## 2023-04-26 LAB — ETHANOL: Alcohol, Ethyl (B): <10 mg/dL (ref <10)

## 2023-04-26 LAB — APTT: aPTT: 27 seconds (ref 24–36)

## 2023-04-26 MED ORDER — ACETAMINOPHEN 325 MG PO TABS
650.0000 mg | ORAL_TABLET | ORAL | Status: DC | PRN
  Administered 2023-04-27 – 2023-05-01 (×7): 650 mg via ORAL
  Filled 2023-04-26 (×7): qty 2

## 2023-04-26 MED ORDER — STROKE: EARLY STAGES OF RECOVERY BOOK: Freq: Once | Status: DC

## 2023-04-26 MED ORDER — MORPHINE SULFATE (PF) 4 MG/ML IV SOLN
4.0000 mg | Freq: Once | INTRAVENOUS | Status: AC
  Administered 2023-04-26: 4 mg via INTRAVENOUS
  Filled 2023-04-26: qty 1

## 2023-04-26 MED ORDER — FENTANYL CITRATE PF 50 MCG/ML IJ SOSY
50.0000 ug | PREFILLED_SYRINGE | Freq: Once | INTRAMUSCULAR | Status: AC
  Administered 2023-04-26: 50 ug via INTRAVENOUS
  Filled 2023-04-26: qty 1

## 2023-04-26 MED ORDER — ACETAMINOPHEN 650 MG RE SUPP: 650.0000 mg | RECTAL | Status: DC | PRN

## 2023-04-26 MED ORDER — PANTOPRAZOLE SODIUM 40 MG IV SOLR
40.0000 mg | Freq: Every day | INTRAVENOUS | Status: DC
  Administered 2023-04-26 – 2023-04-29 (×4): 40 mg via INTRAVENOUS
  Filled 2023-04-26 (×4): qty 10

## 2023-04-26 MED ORDER — NICARDIPINE HCL IN NACL 20-0.86 MG/200ML-% IV SOLN
3.0000 mg/h | INTRAVENOUS | Status: DC
  Administered 2023-04-26: 5 mg/h via INTRAVENOUS
  Administered 2023-04-26: 10 mg/h via INTRAVENOUS
  Administered 2023-04-26: 12.5 mg/h via INTRAVENOUS
  Administered 2023-04-30: 5 mg/h via INTRAVENOUS
  Filled 2023-04-26: qty 600
  Filled 2023-04-26 (×5): qty 200

## 2023-04-26 MED ORDER — IOHEXOL 350 MG/ML SOLN
75.0000 mL | Freq: Once | INTRAVENOUS | Status: AC | PRN
  Administered 2023-04-26: 75 mL via INTRAVENOUS

## 2023-04-26 MED ORDER — ACETAMINOPHEN 160 MG/5ML PO SOLN: 650.0000 mg | ORAL | Status: DC | PRN

## 2023-04-26 MED ORDER — IOHEXOL 350 MG/ML SOLN: 75.0000 mL | Freq: Once | INTRAVENOUS | Status: DC | PRN

## 2023-04-26 MED ORDER — SENNOSIDES-DOCUSATE SODIUM 8.6-50 MG PO TABS
1.0000 | ORAL_TABLET | Freq: Two times a day (BID) | ORAL | Status: DC
  Administered 2023-04-27 – 2023-05-01 (×3): 1 via ORAL
  Filled 2023-04-26 (×5): qty 1

## 2023-04-26 MED ORDER — MORPHINE SULFATE (PF) 2 MG/ML IV SOLN
2.0000 mg | INTRAVENOUS | Status: DC | PRN
  Administered 2023-04-26 – 2023-04-30 (×5): 2 mg via INTRAVENOUS
  Filled 2023-04-26 (×5): qty 1

## 2023-04-26 MED ORDER — CHLORHEXIDINE GLUCONATE CLOTH 2 % EX PADS
6.0000 | MEDICATED_PAD | Freq: Every day | CUTANEOUS | Status: DC
  Administered 2023-04-26 – 2023-04-30 (×4): 6 via TOPICAL

## 2023-04-26 MED ORDER — TRAMADOL HCL 50 MG PO TABS
50.0000 mg | ORAL_TABLET | Freq: Four times a day (QID) | ORAL | Status: DC | PRN
  Administered 2023-04-27 – 2023-04-30 (×8): 100 mg via ORAL
  Filled 2023-04-26 (×11): qty 2

## 2023-04-26 NOTE — Consult Note (Signed)
   Providing Compassionate, Quality Care - Together  Neurosurgery Consult  Referring physician: Dr. Silverio Lay Reason for referral: Intraventricular hemorrhage  Chief Complaint: Headache  History of Present Illness: This is a 47 year old male, with no significant past medical history, with complaints of worsening headache today after exercising.  The headache was quite severe, radiating from the back of his head and neck that started earlier today.  He felt very fatigued due to this.  He denied any loss of consciousness, numbness, tingling or weakness.  Denies any nausea or vomiting or vision changes.  Denies any recent travels.   History reviewed. No pertinent past medical history. History reviewed. No pertinent surgical history.  Medications: I have reviewed the patient's current medications. Allergies: No Known Allergies  History reviewed. No pertinent family history. Social History:  has no history on file for tobacco use, alcohol use, and drug use.  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  No evidence of hydrocephalus on imaging, patient is fully awake and alert -Agree with SBP control less than 140   Thank you for allowing me to participate in this patient's care.  Please do not hesitate to call with questions or concerns.   Monia Pouch, DO Neurosurgeon [facility] Cell: [phone]

## 2023-04-26 NOTE — ED Provider Notes (Signed)
Patient sent over from [place]. Sudden onset headache from exercise. + bleed from head CT earlier. On cleviprex. Awaiting MRI.  Patient admitted to neuro ICU under the neurosurgical service.   Arthor Captain, PA-C 04/26/23 2356    Loetta Rough, MD 04/28/23 [phone]

## 2023-04-26 NOTE — ED Provider Notes (Signed)
[place] EMERGENCY DEPARTMENT AT [facility] Provider Note   CSN: [id number] Arrival date & time: 04/26/23  1506     History  Chief Complaint  Patient presents with   Headache    Nicholas Lawson is a 47 y.o. male.  The history is provided by the patient and medical records. No language interpreter was used.  Headache    47 year old male presenting with complaint of headache.  Patient reports acute onset of severe headache headache to the back of his head and neck that started approximately 3 hours ago while he was working out.  Headache is intense and he felt very fatigued.  As he was walking, he felt like he was going to pass out.  This is new.  He tries to go home and sleep but headache persist prompting this ER visit.  He does not endorse any vision changes no nausea or vomiting no focal numbness or focal weakness no rash.  Denies any fever or chills.  Denies any recent sickness.  Patient does not normally have headache.    Home Medications Prior to Admission medications   Not on File      Allergies    Bee venom    Review of Systems   Review of Systems  Neurological:  Positive for headaches.  All other systems reviewed and are negative.   Physical Exam Updated Vital Signs BP (!) 148/103 (BP Location: Left Arm)   Pulse 78   Temp 98.7 F (37.1 C) (Oral)   Resp 16   Ht 6' (1.829 m)   Wt 85.7 kg   SpO2 99%   BMI 25.63 kg/m  Physical Exam Vitals and nursing note reviewed.  Constitutional:      General: He is not in acute distress.    Appearance: He is well-developed.  HENT:     Head: Normocephalic and atraumatic.  Eyes:     General: No visual field deficit.    Extraocular Movements: Extraocular movements intact.     Conjunctiva/sclera: Conjunctivae normal.     Pupils: Pupils are equal, round, and reactive to light.  Neck:     Meningeal: Brudzinski's sign and Kernig's sign absent.  Cardiovascular:     Rate and Rhythm: Normal rate and regular  rhythm.     Heart sounds: Normal heart sounds.  Pulmonary:     Effort: Pulmonary effort is normal.     Breath sounds: Normal breath sounds.  Abdominal:     General: Bowel sounds are normal.     Palpations: Abdomen is soft.  Musculoskeletal:        General: Normal range of motion.     Cervical back: Normal range of motion and neck supple. No rigidity.  Lymphadenopathy:     Cervical: No cervical adenopathy.  Skin:    General: Skin is warm.     Capillary Refill: Capillary refill takes less than 2 seconds.     Findings: No rash.  Neurological:     Mental Status: He is alert and oriented to person, place, and time.     GCS: GCS eye subscore is 4. GCS verbal subscore is 5. GCS motor subscore is 6.     Cranial Nerves: No cranial nerve deficit, dysarthria or facial asymmetry.  Psychiatric:        Mood and Affect: Mood normal.     ED Results / Procedures / Treatments   Labs (all labs ordered are listed, but only abnormal results are displayed) Labs Reviewed  I-STAT CHEM  8, ED - Abnormal; Notable for the following components:      Result Value   Glucose, Bld 109 (*)    TCO2 35 (*)    All other components within normal limits  CBC WITH DIFFERENTIAL/PLATELET  ETHANOL  PROTIME-INR  APTT  COMPREHENSIVE METABOLIC PANEL  RAPID URINE DRUG SCREEN, HOSP PERFORMED  URINALYSIS, ROUTINE W REFLEX MICROSCOPIC    EKG None  Radiology CT Head Wo Contrast  Result Date: 04/26/2023 CLINICAL DATA:  Headache, sudden, severe. EXAM: CT HEAD WITHOUT CONTRAST TECHNIQUE: Contiguous axial images were obtained from the base of the skull through the vertex without intravenous contrast. RADIATION DOSE REDUCTION: This exam was performed according to the departmental dose-optimization program which includes automated exposure control, adjustment of the mA and/or kV according to patient size and/or use of iterative reconstruction technique. COMPARISON:  None Available. FINDINGS: Brain: Diffuse intraventricular  hemorrhage centered around the atrium of the right lateral ventricle, where serpiginous lower density extends across the midline and into the quadrigeminal plate cistern (axial image 25 series 2, sagittal image 50 series 5), suspicious for Galenic arteriovenous malformation. Effacement of the suprasellar and prepontine cisterns. No tonsillar herniation at this time. Vascular: As above. Skull: No calvarial fracture or suspicious bone lesion. Skull base is unremarkable. Sinuses/Orbits: Unremarkable. Other: None. IMPRESSION: 1. Diffuse intraventricular hemorrhage centered around the atrium of the right lateral ventricle, where serpiginous lower density extends across the midline and into the quadrigeminal plate cistern, suspicious for Galenic arteriovenous malformation. 2. Effacement of the suprasellar and prepontine cisterns. No tonsillar herniation at this time. Critical Value/emergent results were called by telephone at the time of interpretation on 04/26/2023 at 6:17 pm to provider [facility] , who verbally acknowledged these results. Electronically Signed   By: Orvan Falconer M.D.   On: 04/26/2023 18:17    Procedures .Critical Care  Performed by: Fayrene Helper, PA-C Authorized by: Fayrene Helper, PA-C   Critical care provider statement:    Critical care time (minutes):  30   Critical care was time spent personally by me on the following activities:  Development of treatment plan with patient or surrogate, discussions with consultants, evaluation of patient's response to treatment, examination of patient, ordering and review of laboratory studies, ordering and review of radiographic studies, ordering and performing treatments and interventions, pulse oximetry, re-evaluation of patient's condition and review of old charts     Medications Ordered in ED Medications  nicardipine (CARDENE) 20mg  in 0.86% saline IV infusion (0.1 mg/ml) (5 mg/hr Intravenous New Bag/Given 04/26/23 1855)  morphine (PF) 4 MG/ML  injection 4 mg (4 mg Intravenous Given 04/26/23 1643)  fentaNYL (SUBLIMAZE) injection 50 mcg (50 mcg Intravenous Given 04/26/23 1907)    ED Course/ Medical Decision Making/ A&P                             Medical Decision Making Amount and/or Complexity of Data Reviewed Labs: ordered. Radiology: ordered.  Risk Prescription drug management.   BP (!) 148/103 (BP Location: Left Arm)   Pulse 78   Temp 98.7 F (37.1 C) (Oral)   Resp 16   Ht 6' (1.829 m)   Wt 85.7 kg   SpO2 99%   BMI 25.63 kg/m   64:45 PM  47 year old male presenting with complaint of headache.  Patient reports acute onset of severe headache headache to the back of his head and neck that started approximately 3 hours ago while he was working  out.  Headache is intense and he felt very fatigued.  As he was walking, he felt like he was going to pass out.  This is new.  He tries to go home and sleep but headache persist prompting this ER visit.  He does not endorse any vision changes no nausea or vomiting no focal numbness or focal weakness no rash.  Denies any fever or chills.  Denies any recent sickness.  Patient does not normally have headache.    On exam, patient is sitting in the chair with his eyes slightly squinted but otherwise in no acute discomfort.  Head is normocephalic atraumatic some tenderness noted to his occiput and posterior neck on palpation without focal point tenderness.  No nuchal rigidity noted.  He has equal strength throughout, no focal neurodeficit, no overlying skin rash.  Vital signs reviewed overall reassuring.  Due to acute onset thunderclap headache while working out, and is still within the 6-hour window, will obtain head CT without contrast for further assessment  -Labs ordered, independently viewed and interpreted by me.  Lab is currently pending -The patient was maintained on a cardiac monitor.  I personally viewed and interpreted the cardiac monitored which showed an underlying rhythm of:  NSR -Imaging independently viewed and interpreted by me and I agree with radiologist's interpretation.  Result remarkable for head CT showing intraventricular hemorrhage -This patient presents to the ED for concern of headache, this involves an extensive number of treatment options, and is a complaint that carries with it a high risk of complications and morbidity.  The differential diagnosis includes SAH, tension headache, dissection, migraine, cluster, meningitis -Co morbidities that complicate the patient evaluation includes none -Treatment includes nicardipine -Reevaluation of the patient after these medicines showed that the patient stayed the same -PCP office notes or outside notes reviewed -Discussion with specialist neurosurgery Dr. Jake Samples who request transfer to [facility] to stroke floor -Escalation to admission/observation considered: patient agree with admission.   7:13 PM Appreciate consultation from on call neurologist Dr. Amada Jupiter who agrees to accept pt to [facility] Stroke floor for further management of his condition.  Pt is currently stable for transfer.   7:29 PM After reviewing the scan, neurologist Dr. Amada Jupiter recommends stat MRI as he voiced concerns of subarachnoid bleed.  I discussed this with Dr. Silverio Lay. We felt patient needs to be transferred emergently ER to ER to [facility] for MRI and for close monitoring and management by neurology team.  Average for prompt transfer [facility] as patient will need to be monitored very closely by neurology team in the event that his condition deteriorates.        Final Clinical Impression(s) / ED Diagnoses Final diagnoses:  Closed intraventricular hemorrhage [facility])    Rx / DC Orders ED Discharge Orders     None         Fayrene Helper, PA-C 04/26/23 1948    Charlynne Pander, MD 04/26/23 [phone]

## 2023-04-26 NOTE — ED Notes (Signed)
Patient currently in MRI with this RN monitoring.

## 2023-04-26 NOTE — ED Notes (Signed)
Patient taken to MRI with this RN.

## 2023-04-26 NOTE — ED Triage Notes (Signed)
Pt c/o severe HA that began at 1200 today. Reports feeling fatigued. No dizziness, or photophobia

## 2023-04-26 NOTE — ED Notes (Signed)
Report was given to [facility] at [facility] also contacted.

## 2023-04-27 ENCOUNTER — Inpatient Hospital Stay (HOSPITAL_COMMUNITY): Payer: BLUE CROSS/BLUE SHIELD

## 2023-04-27 ENCOUNTER — Other Ambulatory Visit (HOSPITAL_COMMUNITY): Payer: BLUE CROSS/BLUE SHIELD

## 2023-04-27 DIAGNOSIS — I6389 Other cerebral infarction: Secondary | ICD-10-CM | POA: Diagnosis not present

## 2023-04-27 LAB — ECHOCARDIOGRAM COMPLETE
Area-P 1/2: 4.86 cm2
Height: 72 in
S' Lateral: 3.3 cm
Weight: 2895.96 oz

## 2023-04-27 LAB — MRSA NEXT GEN BY PCR, NASAL: MRSA by PCR Next Gen: NOT DETECTED

## 2023-04-27 NOTE — Progress Notes (Signed)
   Providing Compassionate, Quality Care - Together  NEUROSURGERY PROGRESS NOTE   S: No issues overnight.  Intermittent headaches remains  O: EXAM:  BP (!) 129/97   Pulse 74   Temp 98.7 F (37.1 C)   Resp 16   Ht 6' (1.829 m)   Wt 82.1 kg   SpO2 96%   BMI 24.55 kg/m   Awake, alert, oriented x 3 PERRL Speech fluent, appropriate  CNs grossly intact  5/5 BUE/BLE  No drift  ASSESSMENT:  47 y.o. 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  Will need diagnostic cerebral angiogram, will notify Dr. Conchita Paris -Plan for angiogram likely tomorrow, will be n.p.o. at midnight -Continue close neuromonitoring and SBP control    Thank you for allowing me to participate in this patient's care.  Please do not hesitate to call with questions or concerns.   Monia Pouch, DO Neurosurgeon [facility] Cell: [phone]

## 2023-04-27 NOTE — Evaluation (Signed)
Physical Therapy Evaluation Patient Details Name: Nicholas Lawson MRN: [id number] DOB: 03-Jan-1976 Today's Date: 04/27/2023  History of Present Illness  The pt is a 47 yo male presenting 6/8 with severe HA after exercising. 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 No significant PMH.   Clinical Impression  Pt in bed upon arrival of PT, agreeable to evaluation at this time. 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 The pt now presents with minor limitations in functional mobility, and activity tolerance due to pain from headache, but was able to demo great strength and stability with all transfers and ambulation. Pt's BP remained stable with exertion but pt did report increase in pain from 2/10 at rest to 7-810 with activity. No current needs for DME or follow up, will continue to benefit from skilled PT acutely to progress endurance and independence with activity prior to return home.    Recommendations for follow up therapy are one component of a multi-disciplinary discharge planning process, led by the attending physician.  Recommendations may be updated based on patient status, additional functional criteria and insurance authorization.  Follow Up Recommendations       Assistance Recommended at Discharge PRN  Patient can return home with the following  Assist for transportation;Help with stairs or ramp for entrance    Equipment Recommendations None recommended by PT  Recommendations for Other Services       Functional Status Assessment Patient has had a recent decline in their functional status and demonstrates the ability to make significant improvements in function in a reasonable and predictable amount of  time.     Precautions / Restrictions Precautions Precautions: Fall Precaution Comments: BP <140 Restrictions Weight Bearing Restrictions: No      Mobility  Bed Mobility Overal bed mobility: Independent                  Transfers Overall transfer level: Needs assistance Equipment used: None Transfers: Sit to/from Stand Sit to Stand: Supervision           General transfer comment: supervision for safety, BP stable    Ambulation/Gait Ambulation/Gait assistance: Supervision Gait Distance (Feet): 300 Feet Assistive device: None Gait Pattern/deviations: Step-through pattern, Decreased stride length Gait velocity: WFL but decreased compared to pt/s normal Gait velocity interpretation: 1.31 - 2.62 ft/sec, indicative of limited community ambulator   General Gait Details: pt initially with minimal clearance and slowed speed. improved with continued walking. BP stable and pt reports no additional sx    Modified Rankin (Stroke Patients Only) Modified Rankin (Stroke Patients Only) Pre-Morbid Rankin Score: No symptoms Modified Rankin: Moderate disability     Balance Overall balance assessment: No apparent balance deficits (not formally assessed)                                           Pertinent Vitals/Pain Pain Assessment Pain Assessment: Faces Pain Score: 8  Faces Pain Scale: Hurts even more Pain Location: 2 in bed, 7-8 with moving. headache. throbbing, sharp at times. mostly posterior Pain Descriptors / Indicators: Headache, Throbbing Pain Intervention(s): Limited activity within patient's tolerance, Monitored during session, Premedicated before session, Repositioned  Home Living Family/patient expects to be discharged to:: Private residence Living Arrangements: Children Available Help at Discharge: Family;Available 24 hours/day Type of Home: House Home Access: Stairs to enter Entrance Stairs-Rails: Right;Left;Can reach both Entrance  Stairs-Number of Steps: 3   Home Layout: One level Home Equipment: Grab bars - tub/shower      Prior Function Prior Level of Function : Independent/Modified Independent;Driving;Working/employed             Mobility Comments: independent, workin out regularly ADLs Comments: 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    Extremity/Trunk Assessment   Upper Extremity Assessment Upper Extremity Assessment: Overall WFL for tasks assessed    Lower Extremity Assessment Lower Extremity Assessment: Overall WFL for tasks assessed    Cervical / Trunk Assessment Cervical / Trunk Assessment: Normal  Communication   Communication: No difficulties  Cognition Arousal/Alertness: Awake/alert Behavior During Therapy: WFL for tasks assessed/performed                                            General Comments General comments (skin integrity, edema, etc.): SBP mostly 110-120    Exercises     Assessment/Plan    PT Assessment Patient needs continued PT services  PT Problem List Decreased activity tolerance       PT Treatment Interventions Gait training;Stair training;Functional mobility training;Therapeutic activities;Therapeutic exercise;Balance training    PT Goals (Current goals can be found in the Care Plan section)  Acute Rehab PT Goals Patient Stated Goal: return home, was about to start cooking position here at [facility] PT Goal Formulation: With patient Time For Goal Achievement: 05/11/23 Potential to Achieve Goals: Good    Frequency Min 3X/week        AM-PAC PT "6 Clicks" Mobility  Outcome Measure Help needed turning from your back to your side while in a flat bed without using bedrails?: None Help needed moving from lying on your back to sitting on the side of a flat bed without using bedrails?: None Help needed moving to and from a bed to a chair (including a wheelchair)?: A Little Help needed standing up from a  chair using your arms (e.g., wheelchair or bedside chair)?: A Little Help needed to walk in hospital room?: A Little Help needed climbing 3-5 steps with a railing? : A Little 6 Click Score: 20    End of Session Equipment Utilized During Treatment: Gait belt Activity Tolerance: Patient tolerated treatment well Patient left: in chair;with call bell/phone within reach Nurse Communication: Mobility status PT Visit Diagnosis: Other abnormalities of gait and mobility (R26.89);Pain Pain - Right/Left:  (headache) Pain - part of body:  (headache)    Time: 1610-9604 PT Time Calculation (min) (ACUTE ONLY): 26 min   Charges:   PT Evaluation $PT Eval Low Complexity: 1 Low PT Treatments $Therapeutic Exercise: 8-22 mins        Vickki Muff, PT, DPT   Acute Rehabilitation Department Office [phone] Secure Chat Communication Preferred  Ronnie Derby 04/27/2023, 11:20 AM

## 2023-04-27 NOTE — Evaluation (Signed)
Speech Language Pathology Evaluation Patient Details Name: DOYEL MULKERN MRN: [id number] DOB: 26-Sep-1976 Today's Date: 04/27/2023 Time: 1025-1040 SLP Time Calculation (min) (ACUTE ONLY): 15 min  Problem List:  Patient Active Problem List   Diagnosis Date Noted   IVH (intraventricular hemorrhage) (HCC) 04/26/2023   Back pain 09/07/2014   Past Medical History: No past medical history on file. Past Surgical History:  Past Surgical History:  Procedure Laterality Date   ROTATOR CUFF REPAIR Right    SCAPHOID FRACTURE SURGERY Right    HPI:  47 y.o. M presents with severe headache and fatigue. Intraventricular hemorrhage without hydrocephalus; new findings of AVM.   Assessment / Plan / Recommendation Clinical Impression  Mr. Cabello denies any changes to speech/language/cognition/swallowing. He was light sensitive with HA, so evaluation was done in dimly lit room. Oral mech exam was WNL. He scored 30/30 on SLUMS with timely responses. No concerns for speech/language/cognition or swallowing. He inquired about eating, as he is NPO in system. SLP discussed with RN, who is planning to enter diet once cleared by MD. No further SLP is indicated at this time.    SLP Assessment  SLP Recommendation/Assessment: Patient does not need any further Speech Lanaguage Pathology Services SLP Visit Diagnosis: Cognitive communication deficit (R41.841)    Recommendations for follow up therapy are one component of a multi-disciplinary discharge planning process, led by the attending physician.  Recommendations may be updated based on patient status, additional functional criteria and insurance authorization.    Follow Up Recommendations  No SLP follow up       SLP Evaluation Cognition  Overall Cognitive Status: Within Functional Limits for tasks assessed Arousal/Alertness: Awake/alert Orientation Level: Oriented X4 Memory: Appears intact Awareness: Appears intact Problem Solving: Appears  intact Safety/Judgment: Appears intact       Comprehension  Auditory Comprehension Overall Auditory Comprehension: Appears within functional limits for tasks assessed Yes/No Questions: Within Functional Limits Commands: Within Functional Limits    Expression Expression Primary Mode of Expression: Verbal Verbal Expression Overall Verbal Expression: Appears within functional limits for tasks assessed Initiation: No impairment Repetition: No impairment Naming: No impairment Pragmatics: No impairment   Oral / Motor  Oral Motor/Sensory Function Overall Oral Motor/Sensory Function: Within functional limits Motor Speech Overall Motor Speech: Appears within functional limits for tasks assessed Respiration: Within functional limits Phonation: Normal Resonance: Within functional limits Articulation: Within functional limitis Intelligibility: Intelligible Motor Planning: Witnin functional limits Motor Speech Errors: Not applicable           Raylei Losurdo P. Alvaretta Eisenberger, M.S., CCC-SLP Speech-Language Pathologist Acute Rehabilitation Services Pager: [phone]  Susanne Borders Marcedes Tech 04/27/2023, 10:44 AM

## 2023-04-27 NOTE — Progress Notes (Signed)
Called Dr. Jake Samples to see if pt could be sips with meds to allow for oral pain meds.  VO for 2 mg IV Morphine PRN q 4hr placed instead.  Will keep pt strictly NPO and place Morphine order.

## 2023-04-28 LAB — HIV ANTIBODY (ROUTINE TESTING W REFLEX): HIV Screen 4th Generation wRfx: NONREACTIVE

## 2023-04-28 NOTE — Evaluation (Signed)
Occupational Therapy Evaluation Patient Details Name: Nicholas Lawson MRN: [id number] DOB: 1976-09-14 Today's Date: 04/28/2023   History of Present Illness The pt is a 47 yo male presenting 6/8 with severe HA after exercising. 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 No significant PMH.   Clinical Impression   Patient admitted for above and presents at independent level for ADLs, transfers and functional mobility. Cognition, sensation, coordination, and vision are [facility].  BP monitored during session, 134/95 at EOB and increased to 153/106 after activity - RN notified and not concerned at this time.  No further OT needs have been identified, pt has no further questions or concerns, and OT will sign off. Thank you for this referral!       Recommendations for follow up therapy are one component of a multi-disciplinary discharge planning process, led by the attending physician.  Recommendations may be updated based on patient status, additional functional criteria and insurance authorization.   Assistance Recommended at Discharge None  Patient can return home with the following      Functional Status Assessment     Equipment Recommendations  None recommended by OT    Recommendations for Other Services       Precautions / Restrictions Precautions Precautions: Fall Precaution Comments: BP <140 Restrictions Weight Bearing Restrictions: No      Mobility Bed Mobility Overal bed mobility: Independent                  Transfers Overall transfer level: Independent                        Balance Overall balance assessment: No apparent balance deficits (not formally assessed)                                         ADL either performed or assessed with  clinical judgement   ADL Overall ADL's : Independent                                             Vision Baseline Vision/History: 0 No visual deficits Vision Assessment?: No apparent visual deficits     Perception     Praxis      Pertinent Vitals/Pain Pain Assessment Pain Assessment: 0-10 Pain Score: 4  Pain Location: 2 in bed, 4 with actiivty Pain Descriptors / Indicators: Headache, Throbbing Pain Intervention(s): Limited activity within patient's tolerance, Monitored during session, Repositioned, Premedicated before session     Hand Dominance Right   Extremity/Trunk Assessment Upper Extremity Assessment Upper Extremity Assessment: Overall WFL for tasks assessed   Lower Extremity Assessment Lower Extremity Assessment: Defer to PT evaluation   Cervical / Trunk Assessment Cervical / Trunk Assessment: Normal   Communication Communication Communication: No difficulties   Cognition Arousal/Alertness: Awake/alert Behavior During Therapy: WFL for tasks assessed/performed Overall Cognitive Status: Within Functional Limits for tasks assessed                                       General Comments  BP 134/95 supine and 153/106 after activity- RN notified    Exercises     Shoulder Instructions      Home Living Family/patient expects to be discharged to:: Private residence Living Arrangements: Children Available Help at Discharge: Family;Available 24 hours/day Type of Home: House Home Access: Stairs to enter [employer] of Steps: 3 Entrance Stairs-Rails: Right;Left;Can reach both Home Layout: One level     Bathroom Shower/Tub: Chief Strategy Officer: Handicapped height     Home Equipment: Grab bars - tub/shower          Prior Functioning/Environment Prior Level of Function : Independent/Modified Independent;Driving;Working/employed             Mobility Comments: independent, workin out  regularly ADLs Comments: independent works as a Veterinary surgeon:        OT Treatment/Interventions:      OT Goals(Current goals can be found in the care plan section) Acute Rehab OT Goals Patient Stated Goal: get better OT Goal Formulation: With patient  OT Frequency:      Co-evaluation              AM-PAC OT "6 Clicks" Daily Activity     Outcome Measure Help from another person eating meals?: None Help from another person taking care of personal grooming?: None Help from another person toileting, which includes using toliet, bedpan, or urinal?: None Help from another person bathing (including washing, rinsing, drying)?: None Help from another person to put on and taking off regular upper body clothing?: None Help from another person to put on and taking off regular lower body clothing?: None 6 Click Score: 24   End of Session Nurse Communication: Mobility status;Other (comment) (BP)  Activity Tolerance: Patient tolerated treatment well Patient left: in bed;with call bell/phone within reach  OT Visit Diagnosis: Other abnormalities of gait and mobility (R26.89);Pain Pain - part of body:  (headache)                Time: 1610-9604 OT Time Calculation (min): 18 min Charges:  OT General Charges $OT Visit: 1 Visit OT Evaluation $OT Eval Low Complexity: 1 Low  Barry Brunner, OT Acute Rehabilitation Services Office [phone]   Chancy Milroy 04/28/2023, 1:44 PM

## 2023-04-28 NOTE — Progress Notes (Signed)
  NEUROSURGERY PROGRESS NOTE   No issues overnight. History reviewed with Dr. Jake Samples and pt. Briefly, had sudden onset HA while lifting weights on Sat. No associated N/T/W or visual changes.   Denies any significant medical history. Does report smoking about 1pk/wk. FHx negative for AVM or aneurysm, pt reports mom had a "blood clot" in the brain.  EXAM:  BP 134/71   Pulse 62   Temp 98.2 F (36.8 C) (Oral)   Resp 11   Ht 6' (1.829 m)   Wt 82.1 kg   SpO2 97%   BMI 24.55 kg/m   Awake, alert, oriented  Speech fluent, appropriate  CN grossly intact  5/5 BUE/BLE   IMAGING: CT/CTA reviewed and demonstrates SAH primarily confined to quadrigeminal cistern, and associated right lateral and fourth ventricular hemorrhage. Mild ventriculomegaly. CTA shows an AVM involving the splenium with venous drainage primarily involving the Galenic system.  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 We have also reviewed in detail the risks, benefits, and alternatives to the procedure. All questions were answered and Jomari T Knoll provided informed consent to proceed.  Lisbeth Renshaw, MD [facility]     Lisbeth Renshaw, MD [facility]

## 2023-04-28 NOTE — Progress Notes (Signed)
Confirmed with Dr. Conchita Paris, patient can eat an early breakfast morning of 6/11 prior to afternoon IR procedure. Spoke with patient regarding this, will report to night shift RN.

## 2023-04-29 ENCOUNTER — Inpatient Hospital Stay (HOSPITAL_COMMUNITY): Payer: BLUE CROSS/BLUE SHIELD

## 2023-04-29 HISTORY — PX: IR 3D INDEPENDENT WKST: IMG2385

## 2023-04-29 HISTORY — PX: IR ANGIO VERTEBRAL SEL VERTEBRAL BILAT MOD SED: IMG5369

## 2023-04-29 HISTORY — PX: IR ANGIO INTRA EXTRACRAN SEL INTERNAL CAROTID BILAT MOD SED: IMG5363

## 2023-04-29 HISTORY — PX: IR ANGIO EXTERNAL CAROTID SEL EXT CAROTID UNI R MOD SED: IMG5371

## 2023-04-29 MED ORDER — FENTANYL CITRATE (PF) 100 MCG/2ML IJ SOLN
INTRAMUSCULAR | Status: AC
  Filled 2023-04-29: qty 2

## 2023-04-29 MED ORDER — IOHEXOL 300 MG/ML  SOLN
100.0000 mL | Freq: Once | INTRAMUSCULAR | Status: AC | PRN
  Administered 2023-04-29: 25 mL via INTRA_ARTERIAL

## 2023-04-29 MED ORDER — IOHEXOL 300 MG/ML  SOLN
100.0000 mL | Freq: Once | INTRAMUSCULAR | Status: AC | PRN
  Administered 2023-04-29: 50 mL via INTRA_ARTERIAL

## 2023-04-29 MED ORDER — FENTANYL CITRATE (PF) 100 MCG/2ML IJ SOLN
INTRAMUSCULAR | Status: AC | PRN
  Administered 2023-04-29: 50 ug via INTRAVENOUS

## 2023-04-29 MED ORDER — LIDOCAINE HCL 1 % IJ SOLN
INTRAMUSCULAR | Status: AC
  Filled 2023-04-29: qty 20

## 2023-04-29 MED ORDER — MIDAZOLAM HCL 2 MG/2ML IJ SOLN
INTRAMUSCULAR | Status: AC
  Filled 2023-04-29: qty 2

## 2023-04-29 NOTE — Progress Notes (Signed)
Physical Therapy Treatment Patient Details Name: Nicholas Lawson MRN: [id number] DOB: 1976/03/13 Today's Date: 04/29/2023   History of Present Illness The pt is a 47 yo male presenting 6/8 with severe HA after exercising. 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 No significant PMH.    PT Comments    Patient moving well in hallway though still symptomatic with headache and some light sensitivity.  Able to demonstrate safe technique on stairs and completed DGI with score 22/24 and low fall risk.  PT goals met.  Will d/c PT but encouraged pt to ask nursing for help with ambulation due to lines.    Recommendations for follow up therapy are one component of a multi-disciplinary discharge planning process, led by the attending physician.  Recommendations may be updated based on patient status, additional functional criteria and insurance authorization.  Follow Up Recommendations       Assistance Recommended at Discharge PRN  Patient can return home with the following Assist for transportation   Equipment Recommendations  None recommended by PT    Recommendations for Other Services       Precautions / Restrictions Precautions Precaution Comments: BP <140     Mobility  Bed Mobility Overal bed mobility: Independent                  Transfers     Transfers: Sit to/from Stand Sit to Stand: Modified independent (Device/Increase time)           General transfer comment: slower with time to gain balance    Ambulation/Gait Ambulation/Gait assistance: Independent Gait Distance (Feet): 300 Feet Assistive device: None Gait Pattern/deviations: Step-through pattern       General Gait Details: moving around and over obstacles, see DGI   Stairs Stairs: Yes Stairs assistance:  Modified independent (Device/Increase time) Stair Management: One rail Left, Alternating pattern, Forwards Number of Stairs: 3     Wheelchair Mobility    Modified Rankin (Stroke Patients Only) Modified Rankin (Stroke Patients Only) Pre-Morbid Rankin Score: No symptoms Modified Rankin: Moderate disability     Balance     Sitting balance-Leahy Scale: Good       Standing balance-Leahy Scale: Good                   Standardized Balance Assessment Standardized Balance Assessment : Dynamic Gait Index   Dynamic Gait Index Level Surface: Normal Change in Gait Speed: Normal Gait with Horizontal Head Turns: Mild Impairment Gait with Vertical Head Turns: Normal Gait and Pivot Turn: Normal Step Over Obstacle: Normal Step Around Obstacles: Normal Steps: Mild Impairment Total Score: 22      Cognition Arousal/Alertness: Awake/alert Behavior During Therapy: WFL for tasks assessed/performed Overall Cognitive Status: Within Functional Limits for tasks assessed                                          Exercises      General Comments General comments (skin integrity, edema, etc.): Encouraged to ask RN to help with hallway mobility if staying another day or so      Pertinent Vitals/Pain Pain Assessment Pain Score: 8  Pain Location: headache Pain Descriptors / Indicators: Headache, Throbbing Pain Intervention(s): Monitored during session    Home  Living                          Prior Function            PT Goals (current goals can now be found in the care plan section) Progress towards PT goals: Goals met/education completed, patient discharged from PT    Frequency    Min 3X/week      PT Plan Current plan remains appropriate    Co-evaluation              AM-PAC PT "6 Clicks" Mobility   Outcome Measure  Help needed turning from your back to your side while in a flat bed without using bedrails?: None Help needed  moving from lying on your back to sitting on the side of a flat bed without using bedrails?: None Help needed moving to and from a bed to a chair (including a wheelchair)?: None Help needed standing up from a chair using your arms (e.g., wheelchair or bedside chair)?: None Help needed to walk in hospital room?: None Help needed climbing 3-5 steps with a railing? : None 6 Click Score: 24    End of Session Equipment Utilized During Treatment: Gait belt Activity Tolerance: Patient tolerated treatment well Patient left: Other (comment) (in bathroom, RN aware)   PT Visit Diagnosis: Other abnormalities of gait and mobility (R26.89);Pain Pain - part of body:  (headache)     Time: 9147-8295 PT Time Calculation (min) (ACUTE ONLY): 10 min  Charges:  $Gait Training: 8-22 mins                     Sheran Lawless, PT Acute Rehabilitation Services Office:[phone] 04/29/2023    Elray Mcgregor 04/29/2023, 4:08 PM

## 2023-04-29 NOTE — H&P (Signed)
    Providing Compassionate, Quality Care - Together   Neurosurgery H/P    Chief Complaint: Headache   History of Present Illness: This is a 47 year old male, with no significant past medical history, with complaints of worsening headache today after exercising.  The headache was quite severe, radiating from the back of his head and neck that started earlier today.  He felt very fatigued due to this.  He denied any loss of consciousness, numbness, tingling or weakness.  Denies any nausea or vomiting or vision changes.  Denies any recent travels.    History reviewed. No pertinent past medical history. History reviewed. No pertinent surgical history.   Medications: I have reviewed the patient's current medications. Allergies: No Known Allergies   History reviewed. No pertinent family history. Social History:  has no history on file for tobacco use, alcohol use, and drug use.   ROS: All pertinent positives and negatives are listed in HPI above   Physical Exam:  Vital signs in last 24 hours: Temp:  [98 F (36.7 C)-98.3 F (36.8 C)] 98 F (36.7 C) (07/25 1814) Pulse Rate:  [58-128] 65 (07/26 0746) Resp:  [11-18] 14 (07/26 0217) BP: (138-182)/(65-125) 153/88 (07/26 0700) SpO2:  [91 %-98 %] 96 % (07/26 0746) PE: Awake alert oriented x 3 PERRLA Cranial nerves II through XII intact EOMI Speech fluent and appropriate, face symmetric Bilateral upper/lower extremities full strength throughout     Impression/Assessment:  47 year old male with   Intraventricular hemorrhage without hydrocephalus   Plan:  -Recommend neuro ICU monitoring -MRI brain pending, recommend CT angiogram -Will likely need diagnostic cerebral angiogram for further evaluation, there is some concerning findings of vein of Galen malformation versus arteriovenous malformation -Close neuromonitoring -No acute neurosurgical intervention at this time.  No evidence of hydrocephalus on imaging, patient is fully awake  and alert -Agree with SBP control less than 140

## 2023-04-29 NOTE — Brief Op Note (Signed)
  NEUROSURGERY BRIEF OPERATIVE  NOTE   PREOP DX: AVM  POSTOP DX: Same  PROCEDURE: Diagnostic cerebral angiogram  SURGEON: Dr. Lisbeth Renshaw, MD  ANESTHESIA: IV Sedation with Local  APPROACH: Right trans-femoral  EBL: Minimal  SPECIMENS: None  COMPLICATIONS: None  CONDITION: Stable to ICU  FINDINGS (Full report in [facility]): 1. 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

## 2023-04-29 NOTE — Progress Notes (Signed)
  NEUROSURGERY PROGRESS NOTE   Pt seen and examined. No issues overnight. No complaints this am.  EXAM: Temp:  [98 F (36.7 C)-98.5 F (36.9 C)] 98 F (36.7 C) (06/11 0800) Pulse Rate:  [53-77] 58 (06/11 0900) Resp:  [10-23] 12 (06/11 0900) BP: (114-150)/(60-99) 121/64 (06/11 0900) SpO2:  [93 %-99 %] 95 % (06/11 0900) Intake/Output      06/10 0701 06/11 0700 06/11 0701 06/12 0700   P.O. 700    Total Intake(mL/kg) 700 (8.5)    Urine (mL/kg/hr)     Total Output     Net +700         Urine Occurrence 2 x     Awake, alert, oriented Speech fluent CN intact MAE well  LABS: Lab Results  Component Value Date   CREATININE 0.80 04/26/2023   BUN 11 04/26/2023   NA 138 04/26/2023   K 4.8 04/26/2023   CL 101 04/26/2023   CO2 27 04/26/2023   Lab Results  Component Value Date   WBC 6.8 04/26/2023   HGB 16.3 04/26/2023   HCT 48.0 04/26/2023   MCV 93.9 04/26/2023   PLT 279 04/26/2023     IMPRESSION: - 47 y.o. male presenting with ruptured splenial AVM, neurologically intact, no HCP.  PLAN: - Proceed with diagnostic angiogram today - Cont with strict SBP control    Lisbeth Renshaw, MD [facility]

## 2023-04-30 ENCOUNTER — Inpatient Hospital Stay (HOSPITAL_COMMUNITY): Payer: BLUE CROSS/BLUE SHIELD

## 2023-04-30 ENCOUNTER — Encounter (HOSPITAL_COMMUNITY): Payer: Self-pay

## 2023-04-30 MED ORDER — PANTOPRAZOLE SODIUM 40 MG PO TBEC
40.0000 mg | DELAYED_RELEASE_TABLET | Freq: Every day | ORAL | Status: DC
  Administered 2023-04-30: 40 mg via ORAL
  Filled 2023-04-30: qty 1

## 2023-04-30 MED ORDER — ONDANSETRON HCL 4 MG/2ML IJ SOLN
INTRAMUSCULAR | Status: AC
  Filled 2023-04-30: qty 2

## 2023-04-30 MED ORDER — ONDANSETRON HCL 4 MG/2ML IJ SOLN
4.0000 mg | Freq: Four times a day (QID) | INTRAMUSCULAR | Status: DC | PRN
  Administered 2023-04-30 (×2): 4 mg via INTRAVENOUS
  Filled 2023-04-30 (×2): qty 2

## 2023-04-30 NOTE — Progress Notes (Signed)
  NEUROSURGERY PROGRESS NOTE   No issues overnight but pt had some hypertension and N/V early this am. Resolved now.  EXAM:  BP 131/63   Pulse 74   Temp 98 F (36.7 C) (Oral)   Resp 16   Ht 6' (1.829 m)   Wt 82.1 kg   SpO2 100%   BMI 24.55 kg/m   Awake, alert, oriented  Speech fluent, appropriate  CN grossly intact  5/5 BUE/BLE   IMAGING: Repeat CTH this am reviewed without evidence of progressive HCP. No increase in IVH.  IMPRESSION:  47 y.o. male post-hemorrhage d#4 with ruptured splenial AVM, neurologically intact. N/V this am likely side-effect from tramadol.  PLAN: - Cont observation in ICU - Cont SBP control - Would plan on subacute radiation treatment for AVM.   Lisbeth Renshaw, MD [facility]

## 2023-04-30 NOTE — Progress Notes (Signed)
Patient vomited this am. Noticed as pt's HR was in the 170's which is unusual for him. Order for PRN Zofran and STAT head CT per Dr. Jake Samples. He will reach out to Dr. Conchita Paris and let him know as well.   Lyndee Herbst, [place] Scrape, RN

## 2023-04-30 NOTE — TOC CM/SW Note (Signed)
Transition of Care [facility]) - Inpatient Brief Assessment   Patient Details  Name: Nicholas Lawson MRN: [id number] Date of Birth: 1976-01-09  [facility] CM/SW Contact:    Glennon Mac, RN Phone Number: 04/30/2023, 5:25 PM   Clinical Narrative: The pt is a 47 yo male presenting 6/8 with severe HA after exercising. 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  PTA, pt independent and living with children. PT/OT/ST recommending no OP follow up at discharge. TOC will continue to follow for home needs.    Transition of Care Asessment: Insurance and Status: Insurance coverage has been reviewed Patient has primary care physician: Yes (Dr. Bea Laura Saguier) Home environment has been reviewed: Lives with children Prior level of function:: Independent, works as a Editor, commissioning: No current home services Social Determinants of Health Reivew: SDOH reviewed no interventions necessary Readmission risk has been reviewed: Yes Transition of care needs: transition of care needs identified, TOC will continue to follow   Quintella Baton, RN, BSN  Trauma/Neuro ICU Case Manager [phone]

## 2023-05-01 MED ORDER — ACETAMINOPHEN 325 MG PO TABS: 650.0000 mg | ORAL_TABLET | ORAL | Status: AC | PRN

## 2023-05-01 NOTE — Progress Notes (Signed)
  NEUROSURGERY PROGRESS NOTE   No issues overnight, no real N/V today.  EXAM:  BP 129/69   Pulse 65   Temp 98.1 F (36.7 C) (Oral)   Resp 17   Ht 6' (1.829 m)   Wt 82.1 kg   SpO2 98%   BMI 24.55 kg/m   Awake, alert, oriented  Speech fluent, appropriate  CN grossly intact  5/5 BUE/BLE   IMPRESSION:  47 y.o. male post-hemorrhage d#5 with ruptured splenial AVM, neurologically intact.   PLAN: - d/c home today with outpatient NS and Rad Onc f/u.   Lisbeth Renshaw, MD [facility]

## 2023-05-01 NOTE — Discharge Summary (Signed)
  Physician Discharge Summary  Patient ID: KAHNE HELFAND MRN: [id number] DOB/AGE: [birth year] 47 y.o.  Admit date: 04/26/2023 Discharge date: 05/01/2023  Admission Diagnoses:  Arteriovenous Malformation  Discharge Diagnoses:  Same Principal Problem:   IVH (intraventricular hemorrhage) [facility])   Discharged Condition: Stable  Hospital Course:  Nicholas Lawson is a 47 y.o. male admitted after sudden onset HA with CT demonstrating IVH primarily. Angiogram confirmed a Splenial AVM. He was admitted to the ICU where neurologic exam remained stable. No clinical or CT evidence of HCP. He was discharged home on day 5 in stable condition.  Treatments: Observation, angioram  Discharge Exam: Blood pressure 129/69, pulse 65, temperature 98.1 F (36.7 C), temperature source Oral, resp. rate 17, height 6' (1.829 m), weight 82.1 kg, SpO2 98 %. Awake, alert, oriented Speech fluent, appropriate CN grossly intact 5/5 BUE/BLE Wound c/d/i  Disposition: Discharge disposition: 01-Home or Self Care       Discharge Instructions     Call MD for:  redness, tenderness, or signs of infection (pain, swelling, redness, odor or green/yellow discharge around incision site)   Complete by: As directed    Call MD for:  temperature >100.4   Complete by: As directed    Diet - low sodium heart healthy   Complete by: As directed    Discharge instructions   Complete by: As directed    Walk at home as much as possible, at least 4 times / day   Increase activity slowly   Complete by: As directed    Lifting restrictions   Complete by: As directed    No lifting > 10 lbs   May shower / Bathe   Complete by: As directed    48 hours after surgery   May walk up steps   Complete by: As directed    Other Restrictions   Complete by: As directed    No bending/twisting at waist      Allergies as of 05/01/2023       Reactions   Bee Venom         Medication List     TAKE these medications     acetaminophen 325 MG tablet Commonly known as: TYLENOL Take 2 tablets (650 mg total) by mouth every 4 (four) hours as needed for mild pain (or temp > 37.5 C (99.5 F)).        Follow-up Information     Lisbeth Renshaw, MD Follow up in 2 week(s).   Specialty: Neurosurgery Contact information: [address] [place] [address] [phone]                 Signed: Jackelyn Hoehn 05/01/2023, 2:38 PM

## 2023-05-02 ENCOUNTER — Telehealth: Payer: Self-pay

## 2023-05-02 NOTE — Transitions of Care (Post Inpatient/ED Visit) (Signed)
   05/02/2023  Name: Nicholas Lawson MRN: [id number] DOB: 18-Apr-1976  Today's TOC FU Call Status: Today's TOC FU Call Status:: Successful TOC FU Call Competed TOC FU Call Complete Date: 05/02/23  Transition Care Management Follow-up Telephone Call Date of Discharge: 05/01/23 Discharge Facility: [facility]) Type of Discharge: Inpatient Admission Primary Inpatient Discharge Diagnosis:: intracerebral hemorrhage How have you been since you were released from the hospital?: Better Any questions or concerns?: No  Items Reviewed: Did you receive and understand the discharge instructions provided?: Yes Medications obtained,verified, and reconciled?: Yes (Medications Reviewed) Any new allergies since your discharge?: No Dietary orders reviewed?: Yes Do you have support at home?: Yes People in Home: child(ren), adult  Medications Reviewed Today: Medications Reviewed Today     Reviewed by Karena Addison, LPN (Licensed Practical Nurse) on 05/02/23 at 0911  Med List Status: <None>   Medication Order Taking? Sig Documenting Provider Last Dose Status Informant  acetaminophen (TYLENOL) 325 MG tablet [id number]  Take 2 tablets (650 mg total) by mouth every 4 (four) hours as needed for mild pain (or temp > 37.5 C (99.5 F)). Lisbeth Renshaw, MD  Active             Home Care and Equipment/Supplies: Were Home Health Services Ordered?: NA Any new equipment or medical supplies ordered?: NA  Functional Questionnaire: Do you need assistance with bathing/showering or dressing?: No Do you need assistance with meal preparation?: No Do you need assistance with eating?: No Do you have difficulty maintaining continence: No Do you need assistance with getting out of bed/getting out of a chair/moving?: No Do you have difficulty managing or taking your medications?: No  Follow up appointments reviewed: PCP Follow-up appointment confirmed?: NA Specialist Hospital Follow-up appointment  confirmed?: No Reason Specialist Follow-Up Not Confirmed: Patient has Specialist Provider Number and will Call for Appointment Do you need transportation to your follow-up appointment?: No Do you understand care options if your condition(s) worsen?: Yes-patient verbalized understanding    SIGNATURE Karena Addison, LPN [facility] Nurse Health Advisor Direct Dial [phone]

## 2023-05-15 ENCOUNTER — Telehealth: Payer: Self-pay | Admitting: Radiation Oncology

## 2023-05-15 ENCOUNTER — Other Ambulatory Visit: Payer: Self-pay | Admitting: Radiation Therapy

## 2023-05-15 DIAGNOSIS — Q282 Arteriovenous malformation of cerebral vessels: Secondary | ICD-10-CM

## 2023-05-15 NOTE — Telephone Encounter (Signed)
Pt returned call. As we were scheduling consultation with Dr. Kathrynn Running, it was noted that pt's insurance was OON with [facility]. Pt was advised of this and pt verbalized concern about OOP cost. Pt was advised that referring provider would be notified so care could be transferred ASAP to office that is in-network with his insurance. Securechat sent to Jalene Mullet who advised she would reach out to Dr. Val Riles office to advise of this.

## 2023-05-15 NOTE — Telephone Encounter (Signed)
LVM to schedule CON with Dr. Manning.  

## 2023-06-09 ENCOUNTER — Institutional Professional Consult (permissible substitution): Payer: BLUE CROSS/BLUE SHIELD | Admitting: Radiation Oncology
